# Patient Record
Sex: Male | Born: 1960 | Race: White | Hispanic: No | Marital: Single
Health system: Midwestern US, Academic
[De-identification: ages and names within clinical notes are randomized; demographics above are authoritative.]

---

## 2016-06-13 MED ORDER — LABETALOL 300 MG PO TAB
300 mg | ORAL_TABLET | Freq: Two times a day (BID) | ORAL | 1 refills | 60.00000 days | Status: DC
Start: 2016-06-13 — End: 2016-06-13

## 2016-06-13 MED ORDER — LISINOPRIL 20 MG PO TAB
20 mg | ORAL_TABLET | Freq: Every day | ORAL | 1 refills | Status: DC
Start: 2016-06-13 — End: 2016-08-15

## 2016-06-13 MED ORDER — PRAVASTATIN 40 MG PO TAB
40 mg | ORAL_TABLET | Freq: Every evening | ORAL | 1 refills | 90.00000 days | Status: DC
Start: 2016-06-13 — End: 2016-06-13

## 2016-06-13 MED ORDER — LISINOPRIL 20 MG PO TAB
20 mg | ORAL_TABLET | Freq: Every day | ORAL | 1 refills | Status: DC
Start: 2016-06-13 — End: 2016-06-13

## 2016-06-13 MED ORDER — LABETALOL 300 MG PO TAB
300 mg | ORAL_TABLET | Freq: Two times a day (BID) | ORAL | 1 refills | 60.00000 days | Status: DC
Start: 2016-06-13 — End: 2017-01-22

## 2016-06-13 MED ORDER — PRAVASTATIN 40 MG PO TAB
40 mg | ORAL_TABLET | Freq: Every evening | ORAL | 1 refills | 90.00000 days | Status: DC
Start: 2016-06-13 — End: 2016-08-15

## 2016-08-04 ENCOUNTER — Encounter: Admit: 2016-08-04 | Discharge: 2016-08-04 | Payer: BC Managed Care – PPO

## 2016-08-04 NOTE — Telephone Encounter
-----   Message from Jorje GuildBrian C Weiford, MD sent at 08/04/2016 11:12 AM CDT -----  Adam Russo's triglycerides remain a little high, but are better than they have been in the past.  As well, his LDL cholesterol is much better than last year.  He used to take fish oil, but it looks like he is off it now.  Could you find out if he is or isn't taking any fish oil?  If not, I'd recommend he get back on at least 3 OTC fish oil caps/day.  Otherwise, TLCs (therapeutic lifestyle changes with diet, exercise, and weight loss) would be the most appropriate course at this time.  thanks

## 2016-08-04 NOTE — Telephone Encounter
LVM on phone: [phone] explaining Dr. Shon MilletWeiford's recommendations.  Also released lab results and recommendations to MyChart.

## 2016-08-04 NOTE — Telephone Encounter
Pt called back. We reviewed lab results and Dr. Shon MilletWeiford's recommendations. Pt stated that stopping taking fish oil about 2 weeks ago, but was take 4 fish oil caps/day prior to that. He stated that he would restart.

## 2016-08-15 ENCOUNTER — Ambulatory Visit: Admit: 2016-08-15 | Discharge: 2016-08-16 | Payer: BC Managed Care – PPO

## 2016-08-15 ENCOUNTER — Encounter: Admit: 2016-08-15 | Discharge: 2016-08-15 | Payer: BC Managed Care – PPO

## 2016-08-15 DIAGNOSIS — R931 Abnormal findings on diagnostic imaging of heart and coronary circulation: Secondary | ICD-10-CM

## 2016-08-15 DIAGNOSIS — M1990 Unspecified osteoarthritis, unspecified site: ICD-10-CM

## 2016-08-15 DIAGNOSIS — I10 Essential (primary) hypertension: Principal | ICD-10-CM

## 2016-08-15 NOTE — Progress Notes
Date of Service: 08/15/2016    Adam Russo is a 56 y.o. male.       HPI     Adam Russo presents for evaluation and management of atherosclerotic risk factors.  I last saw him a little over 1 year ago, and he has recently been evaluated in a wellness clinic and wanted to review his test results and get recommendations about ongoing management, including my opinion about changes in his medical regimen that were made in the wellness clinic.  His risks factors for atherosclerosis include hypertension and hypercholesterolemia.  He has no current angina, dyspnea, or palpitation symptoms.  His most recent lipid panel included:  Lab Results   Component Value Date    LDL 89 08/02/2016    HDL 39 (L) 08/02/2016    CHOL 165 08/02/2016    TRIG 183 (H) 08/02/2016    AST 23 08/02/2016    ALT 43 08/02/2016   He reports that he wasn't taking his fish oil as religiously as he should at the time of the above fasting lipid panel.  Since early May of this year, Adam Russo has been taking atorvastatin instead of simva at the recommendation of Dr. Christian Mate, whom Adam Russo saw in April after a [facility] clinic visit.  He also had a coronary artery calcium score which was 43, with calcification noted in the LAD and RCA vessels.     Adam Russo's blood pressure today is 140/100 mm Hg, and he reports that at home, it tends to run in the 120-140/85-105 mm Hg range.  Dr. Rito Ehrlich also started him on Lotrel 5/20 mg daily, which Adam Russo has been on for ~1 month now.    He denies bleeding problems with aspirin or myalgias with statin therapy.  He exercises occasionally--Adam Russo reports staying active but that he does not exercise as regularly as he should.  He is generally trying to follow a heart-healthy diet.       He isn't diabetic, doesn't smoke, and denies secondhand smoke exposure.  Since our last visit, he has lost 13 pounds which he attributes to eating in a healthier fashion and cutting out simple carbohydrates and sugars.       Vitals: 08/15/16 0806   BP: (!) 140/110   Pulse: 72   Weight: 84.8 kg (187 lb)   Height: 1.753 m (5' 9)     Body mass index is 27.62 kg/m???.     Past Medical History  Patient Active Problem List    Diagnosis Date Noted   ??? Agatston CAC score, <100 08/15/2016   ??? Fatty liver: noted incidentally on recent CAC score CT 08/15/2016   ??? Primary osteoarthritis of right knee 10/23/2015   ??? Family history of abdominal aortic aneurysm 05/26/2011   ??? History of Myalgia 02/09/2010   ??? History of Elevated CPK 02/09/2010   ??? Essential hypertension 02/02/2010   ??? Mild concentric left ventricular hypertrophy (LVH) 02/02/2010     ??? 2011 ECG demonstrating voltage criteria for LVH, but has had echo showing LV thickness at the upper limits of normal.  ??? 2013 echocardiography showing mild left ventricular hypertrophy      ??? Mixed hyperlipidemia 02/02/2010         Review of Systems   Constitution: Negative.   HENT: Negative.    Eyes: Negative.    Cardiovascular: Negative.    Respiratory: Negative.    Endocrine: Negative.    Hematologic/Lymphatic: Negative.    Skin: Negative.    Musculoskeletal: Negative.  Gastrointestinal: Negative.    Genitourinary: Negative.    Neurological: Negative.    Psychiatric/Behavioral: Negative.    Allergic/Immunologic: Negative.        Physical Exam  General Appearance: Adam Russo is a gentleman with overweight body habitus who looks his stated age, and appears comfortable in no distress   Skin: warm, no ulcers or xanthomas   Digits and Nails: no cyanosis or clubbing   Eyes: conjunctivae and lids normal, pupils are equal and round   Teeth/Gums/Palate: dentition unremarkable   Lips & Oral Mucosa: no pallor or cyanosis   Neck Veins: neck veins are not distended; normal JVP (< or = 7cm) at 20-30 degrees incline   Chest Inspection: chest is normal in appearance   Respiratory Effort: breathing comfortably, no respiratory distress   Auscultation/Percussion: lungs clear to auscultation, no rales or rhonchi, no wheezing Cardiac Rhythm: regular rhythm and normal rate   Cardiac Auscultation: S1, S2 normal but clear splitting not heard, no rub, no clear S3 or S4 gallops heard  Murmurs: soft systolic murmur at the base without radiation heard on exam today   Peripheral Circulation: normal peripheral circulation   Carotid Arteries: no bruits   Radial Arteries: normal symmetric radial pulses   Abdominal Aorta: no abdominal aortic bruit   Pedal Pulses: normal symmetric pedal pulses   Lower Extremity Edema: no lower extremity edema   Abdominal Exam: soft, non-tender, non-distended, normal bowel sounds; no hepatosplenomegaly or masses   Gait & Station: walks without assistance   Muscle Strength: normal muscle tone  Orientation: oriented to time, place and person   Affect & Mood: appropriate and sustained affect   Language and Memory: patient responsive and seems to comprehend information   Neurologic Exam: neurologically nonfocal, no obvious CN deficits, and moves all extremities     Cardiovascular Studies  No ECG done today--he reports having a normal ECG at [facility] recently (although we haven't been able to obtain the tracing)    Problems Addressed Today  Encounter Diagnoses   Name Primary?   ??? Essential hypertension: moderately elevated BP today, with variable readings at home Yes   ??? Agatston CAC score, <100    ??? Mixed hyperlipidemia: persistently mildly elevated TGs    ??? Family history of abdominal aortic aneurysm    ??? Fatty liver: noted incidentally on recent CAC score CT        Assessment and Plan     1. Essential hypertension: moderately elevated BP today, with variable readings at home    2. Agatston CAC score, <100    3. Mixed hyperlipidemia: persistently mildly elevated TGs    4. Family history of abdominal aortic aneurysm    5. Fatty liver: noted incidentally on recent CAC score CT       I had a long discussion with Adam Russo today about his complex issues and ongoing diagnostic and treatment options.  I asked him to do the following: ??? Get a home automatic blood pressure monitor and start checking blood pressure on a regular basis (at least 2-3 times a week, but ideally every day) first thing in the AM.    ??? Give [place] a status report on the blood pressure readings within 4 weeks.    ??? We discussed that ideally, the goal BP should be < or = 130/80 mm Hg, and if the blood pressure remains higher than goal, we should increase Lotrel to 10/40 mg daily.  ??? Try to keep on a salt restricted diet (2000 milligrams or  less of sodium daily) for stable blood pressure control and prevention of stroke, heart failure and kidney failure.    ??? Keep getting regular (ideally, every day) exercise with a combination of aerobic, resistance, and flexibility exercise.  ??? We discussed that about 150 minutes of aerobic exercise per week is a good goal to aim for at a minimum.    ??? Send me a copy of the planned blood work from [facility] in August.    ??? We discussed that if the triglycerides remain elevated despite TLCs (therapeutic lifestyle changes with diet, exercise, and weight loss), atorvastatin, and religious use of fish oil, we will consider addition of a fibrate agent like fenofibrate.  ??? Return for a follow up clinic visit in 1 year, or sooner for new questions or problems.     Thanks Dr. Newt Lukes for allowing me to see this nice patient.  If I can elaborate any further or be of additional assistance, please don't hesitate to contact me.             Current Medications (including today's revisions)  ??? ALPRAZolam (XANAX) 1 mg tablet Take 1 tablet by mouth at bedtime as needed for Anxiety.   ??? amlodipine-benazepril (LOTREL) 5-20 mg capsule Take 1 capsule by mouth daily.   ??? atorvastatin (LIPITOR) 20 mg tablet Take 20 mg by mouth daily.   ??? azithromycin (ZITHROMAX) 250 mg tablet Take 2 tabs by mouth on day 1, followed by 1 tab by mouth daily on days 2 - 5. (Patient taking differently: as Needed. Take 2 tabs by mouth on day 1, followed by 1 tab by mouth daily on days 2 - 5.)   ??? dextroamphetamine-amphetamine (ADDERALL) 20 mg tablet Take 1 tablet by mouth twice daily Earliest Fill Date: 05/22/16   ??? docosahexanoic acid/epa (FISH OIL PO) Take 4 capsules by mouth daily.   ??? escitalopram oxalate (LEXAPRO) 10 mg tablet Take 1 tablet by mouth daily.   ??? labetalol (NORMODYNE) 300 mg tablet Take 1 tablet by mouth twice daily.           ~40 minutes spent (822 - 859) in face to face conference in the office today, with more than 20 minutes of the visit spent in counseling and coordination of care--BCW.

## 2016-08-15 NOTE — Progress Notes
I called the patient to follow up on the MyChart message he sent. The pt was seen in clinic today and his BP was 140/110. He states [facility] his BP was 172/108 and he was started on Lotrel 5/20 mg daily. He checked his BP several times this afternoon and the readings were 160/96 and 177/104. The pt is going out of the country in 4 weeks and wants to make sure his BP is under better control prior to traveling. I instructed the patient to monitor his BP daily and to call me if the BP remains higher than goal (<130/80). I reviewed Dr. Lollie MarrowWeifords recommendations from the appointment today (if BP is higher than goal  we should increase Lotrel to 10/40 mg daily). The pt will send in BP readings thru Mychart or call my direct number. The pt has been taking the Lotrel in the evening and is asking if he should take in the morning. I reviewed that taking the Lotrel in the morning with his other medications is okay.

## 2016-08-16 DIAGNOSIS — K760 Fatty (change of) liver, not elsewhere classified: ICD-10-CM

## 2016-08-16 DIAGNOSIS — Z8249 Family history of ischemic heart disease and other diseases of the circulatory system: ICD-10-CM

## 2016-08-16 DIAGNOSIS — E782 Mixed hyperlipidemia: ICD-10-CM

## 2016-08-16 DIAGNOSIS — I10 Essential (primary) hypertension: Principal | ICD-10-CM

## 2016-08-18 ENCOUNTER — Encounter: Admit: 2016-08-18 | Discharge: 2016-08-18 | Payer: BC Managed Care – PPO

## 2016-08-28 ENCOUNTER — Encounter: Admit: 2016-08-28 | Discharge: 2016-08-28 | Payer: BC Managed Care – PPO

## 2016-08-28 MED ORDER — DEXTROAMPHETAMINE-AMPHETAMINE 20 MG PO TAB
20 mg | ORAL_TABLET | Freq: Two times a day (BID) | ORAL | 0 refills | Status: AC
Start: 2016-08-28 — End: 2016-08-28

## 2016-08-28 MED ORDER — DEXTROAMPHETAMINE-AMPHETAMINE 20 MG PO TAB
20 mg | ORAL_TABLET | Freq: Two times a day (BID) | ORAL | 0 refills | Status: AC
Start: 2016-08-28 — End: 2017-08-10

## 2017-01-22 ENCOUNTER — Encounter: Admit: 2017-01-22 | Discharge: 2017-01-22 | Payer: BC Managed Care – PPO

## 2017-01-22 MED ORDER — LABETALOL 300 MG PO TAB
300 mg | ORAL_TABLET | Freq: Two times a day (BID) | ORAL | 2 refills | 60.00000 days | Status: AC
Start: 2017-01-22 — End: 2017-06-20

## 2017-01-22 NOTE — Telephone Encounter
Routing to Dr. Greiner for approval.

## 2017-01-23 ENCOUNTER — Encounter: Admit: 2017-01-23 | Discharge: 2017-01-23 | Payer: BC Managed Care – PPO

## 2017-01-23 MED ORDER — ALPRAZOLAM 1 MG PO TAB
1 mg | ORAL_TABLET | Freq: Every evening | ORAL | 5 refills | Status: AC | PRN
Start: 2017-01-23 — End: 2017-08-10

## 2017-01-23 NOTE — Telephone Encounter
Rx called into [facility] pharmacy.

## 2017-01-26 ENCOUNTER — Encounter: Admit: 2017-01-26 | Discharge: 2017-01-26 | Payer: BC Managed Care – PPO

## 2017-01-30 NOTE — Progress Notes
Spoke with patient, he has been scheduled for OV with Dr. Royann ShiversSchroeppel on 02/21/17 to further discuss his options for knee pain/considering injection prior to planned travel in January.

## 2017-02-09 ENCOUNTER — Encounter: Admit: 2017-02-09 | Discharge: 2017-02-09

## 2017-02-09 ENCOUNTER — Encounter: Admit: 2017-02-09 | Discharge: 2017-02-10

## 2017-02-09 DIAGNOSIS — R69 Illness, unspecified: Principal | ICD-10-CM

## 2017-02-14 ENCOUNTER — Encounter: Admit: 2017-02-14 | Discharge: 2017-02-14 | Payer: BC Managed Care – PPO

## 2017-02-19 ENCOUNTER — Encounter: Admit: 2017-02-19 | Discharge: 2017-02-19 | Payer: BC Managed Care – PPO

## 2017-02-19 DIAGNOSIS — M25561 Pain in right knee: Principal | ICD-10-CM

## 2017-02-21 ENCOUNTER — Encounter: Admit: 2017-02-21 | Discharge: 2017-02-21 | Payer: BC Managed Care – PPO

## 2017-02-21 ENCOUNTER — Ambulatory Visit: Admit: 2017-02-21 | Discharge: 2017-02-21 | Payer: BC Managed Care – PPO

## 2017-02-21 ENCOUNTER — Ambulatory Visit: Admit: 2017-02-21 | Discharge: 2017-02-21

## 2017-02-21 DIAGNOSIS — M1712 Unilateral primary osteoarthritis, left knee: ICD-10-CM

## 2017-02-21 DIAGNOSIS — M25561 Pain in right knee: Principal | ICD-10-CM

## 2017-02-21 DIAGNOSIS — M25562 Pain in left knee: ICD-10-CM

## 2017-02-21 DIAGNOSIS — G8929 Other chronic pain: ICD-10-CM

## 2017-02-21 DIAGNOSIS — M1990 Unspecified osteoarthritis, unspecified site: ICD-10-CM

## 2017-02-21 DIAGNOSIS — M1711 Unilateral primary osteoarthritis, right knee: ICD-10-CM

## 2017-02-21 DIAGNOSIS — I10 Essential (primary) hypertension: Principal | ICD-10-CM

## 2017-02-21 NOTE — Procedures
Large Joint Drain/Inject  Location: knee L knee    Consent:   Consent obtained: verbal  Consent given by: patient  Risks discussed: infection, nerve damage, pain, skin discoloration and soft tissue reaction  Alternatives discussed: alternative treatment  Discussed with patient the purpose of the treatment/procedure, other ways of treating my condition, including no treatment/ procedure and the risks and benefits of the alternatives. Patient has decided to proceed with treatment/procedure.        Universal Protocol:  Relevant documents: relevant documents present and verified  Test results: test results available and properly labeled  Imaging studies: imaging studies available  Required items: required blood products, implants, devices, and special equipment available  Site marked: the operative site was marked  Patient identity confirmed: Patient identify confirmed verbally with patient.      Time out: Immediately prior to procedure a "time out" was called to verify the correct patient, procedure, equipment, support staff and site/side marked as required      Procedures Details:   Indications: pain   Prep: povidone-iodine  Local anesthetic: ethyl chloride spray  Needle size: 22 G  Approach: lateral and superior  Medications administered: 4 mL ropivacaine (PF) 0.5% (5 mg/mL); 40 mg triamcinolone acetonide 40 mg/mL  Aspirate amount: 25 mL  Aspirate: serous  Patient tolerance: Patient tolerated the procedure well with no immediate complications. Pressure was applied, and hemostasis was accomplished.

## 2017-02-21 NOTE — Procedures
Large Joint Drain/Inject  Location: knee R knee    Consent:   Consent obtained: verbal  Consent given by: patient  Risks discussed: infection, nerve damage, pain, skin discoloration and soft tissue reaction  Alternatives discussed: alternative treatment  Discussed with patient the purpose of the treatment/procedure, other ways of treating my condition, including no treatment/ procedure and the risks and benefits of the alternatives. Patient has decided to proceed with treatment/procedure.        Universal Protocol:  Relevant documents: relevant documents present and verified  Test results: test results available and properly labeled  Imaging studies: imaging studies available  Required items: required blood products, implants, devices, and special equipment available  Site marked: the operative site was marked  Patient identity confirmed: Patient identify confirmed verbally with patient.      Time out: Immediately prior to procedure a "time out" was called to verify the correct patient, procedure, equipment, support staff and site/side marked as required      Procedures Details:   Indications: pain   Prep: povidone-iodine  Local anesthetic: ethyl chloride spray  Needle size: 22 G  Approach: lateral and superior  Medications administered: 4 mL ropivacaine (PF) 0.5% (5 mg/mL); 40 mg triamcinolone acetonide 40 mg/mL  Aspirate amount: 12 mL  Aspirate: serous  Patient tolerance: Patient tolerated the procedure well with no immediate complications. Pressure was applied, and hemostasis was accomplished.

## 2017-02-21 NOTE — Progress Notes
Date of Service: 02/21/2017    History of Present Illness  Adam Russo is a 56 y.o. male who presents in f/u of his bilateral knees.  He received a right knee aspiration and cortisone injection on December 01, 2015, which he feels provided improvement in his pain.  He continues to localize the majority of his pain along the medial aspect of both knees, as well as mild tenderness along the lateral aspect of his right knee today.  He also reports frequent give-way episodes of both knees and describes occasional mechanical symptoms, such as catching and locking. ???He denies any trauma to both knees or any systemic symptoms such as fever, chills, nausea, vomiting or any pain in any of his other joints.  He received a left knee cortisone injection, performed by an outside physician last month, which he feels provided little to no benefit.  He feels as though the brace he received at his previous evaluation aggravated his medial sided pain after a few days of wear, so he returned it.  Denies new injury.       Review of Systems   Musculoskeletal: Positive for arthralgias.         Objective:         ??? ALPRAZolam (XANAX) 1 mg tablet TAKE 1 TABLET BY MOUTH AT BEDTIME AS NEEDED FOR ANXIETY   ??? amlodipine-benazepril (LOTREL) 5-20 mg capsule Take 1 capsule by mouth daily.   ??? atorvastatin (LIPITOR) 20 mg tablet Take 20 mg by mouth daily.   ??? azithromycin (ZITHROMAX) 250 mg tablet Take 2 tabs by mouth on day 1, followed by 1 tab by mouth daily on days 2 - 5. (Patient taking differently: as Needed. Take 2 tabs by mouth on day 1, followed by 1 tab by mouth daily on days 2 - 5.)   ??? dextroamphetamine-amphetamine (ADDERALL) 20 mg tablet Take 1 tablet by mouth twice daily   ??? docosahexanoic acid/epa (FISH OIL PO) Take 4 capsules by mouth daily.   ??? escitalopram oxalate (LEXAPRO) 10 mg tablet Take 1 tablet by mouth daily.   ??? labetalol (NORMODYNE) 300 mg tablet Take one tablet by mouth twice daily.     Vitals: 02/21/17 1020   BP: (!) 152/97   Pulse: 74   Resp: 16   SpO2: 100%   Weight: 83.9 kg (185 lb)   Height: 175.3 cm (69)     Body mass index is 27.32 kg/m???.     Physical Exam  Ortho Exam  General Healthy appearing 56 y.o. male in no acute distress.  Alert and oriented x 3.  Mood and affect are appropriate today.  ???  Constitutional:  He is oriented to person, place, and time.  He appears well-developed and well-nourished.   HENT:   Head: Normocephalic and atraumatic.   Eyes: Conjunctivae and EOM are normal.   Cardiovascular: Normal rate and intact distal pulses.   Pulmonary/Chest: Effort normal. No respiratory distress.   Neurological:  He is alert and oriented to person, place, and time.   Skin: Skin is warm and dry.   Psychiatric:  He has a normal mood and affect.  His behavior is normal. Judgment and thought content normal.   Nursing note and vitals reviewed.  ???  Knee Exam:  Right???knee was evaluated. ???Skin intact. ???ROM 0-130???degrees. ???Mild to moderate effusion. ???Stable to varus/valgus stress at 0 and 30 degrees, with mild pseudo laxity to valgus stress at 30 degrees. ???Mild???lateral jt line tenderness.  Significant medial jt line  tenderness. ???Medial and lateral McMurray test is negative.  Negative???lachman. ???Negative???anterior drawer. ???Negative???posterior drawer. ???Negative???patellar apprehension test. ???Mild???retropatellar crepitus noted. ???Quad MS 5/5. ???Neurovascularly intact.  ??????  Comments:  Mild genu varum.  Negative pivot shift.  Normal patellar tracking.  Positive grind test.  Patellar and quadriceps tendons are intact and non-tender.  1-2???quadrants???of lateral patellar excursion, with a firm end point.    Knee Exam:  Left???knee was evaluated. ???Skin intact. ???ROM 0-130???degrees, with aggravation of posterior knee pain. ???Mild effusion. ???Stable to varus/valgus stress at 0 and 30 degrees. ???Negative???lateral jt line tenderness.  Positive???medial jt line tenderness. ???Positive medial McMurray test, negative lateral.  Negative???lachman. ???Negative???anterior drawer. ???Negative???posterior drawer. ???Negative???patellar apprehension test. ???Mild???retropatellar crepitus noted. ???Quad MS 5/5. ???Neurovascularly intact.  ??????  Comments:  Mild genu varum.  Negative pivot shift.  Normal patellar tracking.  Positive grind test.  Patellar and quadriceps tendons are intact and non-tender.  1-2???quadrants???of lateral patellar excursion, with a firm end point.    Imaging:  Orthogonal radiographs of both knees were obtained and reviewed.  Right knee:  1. ???Progression of now severe arthrosis involving the right knee medial compartment with flattening of the central medial femoral condyle and bone-on-bone articulation involving the medial compartment.  2. ???Slight lateral subluxation of the tibia with respect to the femur.  3. ???Large osteophyte arising from the superior right patella. Moderate to severe patellofemoral joint arthrosis with small joint effusion.  4. ???Osteochondral bodies are less likely arterial calcifications project posterior to the fibular neck. These may be intra-articular.  Left knee:  1. ???Mild to moderate left knee medial compartment arthrosis, unchanged since prior.  2.  Otherwise mild patellofemoral joint arthrosis with trace effusion. Small osteochondral body posterior to the knee could be intra-articular.    Imaging:  An MRI of his right knee was reviewed.  IMPRESSION:  1. ???High-grade near full-thickness or full-thickness tear of the medial   meniscus posterior root attachment. Nondisplaced horizontal tear component   extends to the junction of the body of the medial meniscus. Mildly   displaced flap component in the inferior medial gutter.  2. ???Tricompartmental arthrosis with high-grade chondrosis involving the   medial compartment and medial trochlear groove of the patellofemoral   compartment.  3. ???Small-to-moderate joint effusion with multiple intra-articular bodies. Approved by Andreas Ohm, M.D. on 10/20/2015 11:38 AM    An MRI of his left knee was reviewed, performed on February 09, 2017, which indicated a Baker's cyst posteriorly, and posterior horn medial meniscus tear noted.       Assessment and Plan:    56 y.o.male with  1. Bilateral knee DJD  2. Full-thickness posterior root medial meniscus tear, right knee  3. Left knee posterior horn medial meniscus tear  4. Baker's cyst, left knee  ???  I discussed the patients history, physical exam, and imaging findings today.  Given the fact he has an upcoming trip planned to [place], he would like to proceed with bilateral knee aspirations, given his mild to moderate effusions, and cortisone???injections today for therapeutic purposes, in conjunction with a thigh-high TED hose for swelling, particularly when sitting for long periods of time on his plane ride to [place].  I again advised him to refrain from repetitive impact activities and discussed the importance of maintaining a consistent low impact exercise program, such as an elliptical machine, stationary bicycle, and/or aquatic therapy. ???We also discussed the options and benefits of PRP injections and Viscosupplementation in the future.  With regard to his right knee, we  again briefly discussed the option of a posterior root repair, but given the degree of arthrosis in his medial compartment, he would be at high risk of persistent medial-sided pain post-operatively. ???He does not require a scheduled followup with [place], but certainly can return at any point in the future as his symptoms dictate. ???All of his questions were answered to his satisfaction. ???He voiced clear understanding of this treatment plan and agreed.    -Ice and NSAIDs.            ATTESTATION    I have taken down these notes in the presence of Dr. Samuel Germany Oriel Rumbold.    Staff name:  Vassie Loll Date:  02/21/2017

## 2017-02-22 ENCOUNTER — Encounter: Admit: 2017-02-22 | Discharge: 2017-02-22 | Payer: BC Managed Care – PPO

## 2017-02-22 DIAGNOSIS — R69 Illness, unspecified: Principal | ICD-10-CM

## 2017-02-23 MED ORDER — CIPROFLOXACIN HCL 500 MG PO TAB
500 mg | ORAL_TABLET | Freq: Two times a day (BID) | ORAL | 0 refills | 10.00000 days | Status: AC
Start: 2017-02-23 — End: 2017-08-10

## 2017-02-23 MED ORDER — DOXYCYCLINE HYCLATE 100 MG PO CAP
ORAL_CAPSULE | ORAL | 0 refills | 8.00000 days | Status: AC
Start: 2017-02-23 — End: 2017-08-10

## 2017-02-23 MED ORDER — DIAZEPAM 10 MG PO TAB
10 mg | ORAL_TABLET | ORAL | 2 refills | 7.00000 days | Status: AC | PRN
Start: 2017-02-23 — End: 2017-08-10

## 2017-02-23 MED ORDER — AZITHROMYCIN 250 MG PO TAB
ORAL_TABLET | Freq: Every day | 0 refills | Status: AC
Start: 2017-02-23 — End: 2017-08-10

## 2017-02-25 MED ORDER — ROPIVACAINE (PF) 5 MG/ML (0.5 %) IJ SOLN
4 mL | Freq: Once | INTRAMUSCULAR | 0 refills | Status: CP | PRN
Start: 2017-02-25 — End: ?
  Administered 2017-02-21: 17:00:00 4 mL via INTRAMUSCULAR

## 2017-02-25 MED ORDER — TRIAMCINOLONE ACETONIDE 40 MG/ML IJ SUSP
40 mg | Freq: Once | INTRAMUSCULAR | 0 refills | Status: CP | PRN
Start: 2017-02-25 — End: ?
  Administered 2017-02-21: 17:00:00 40 mg via INTRAMUSCULAR

## 2017-02-26 NOTE — Progress Notes
Called Diazepam into the pharmacy

## 2017-05-27 ENCOUNTER — Encounter: Admit: 2017-05-27 | Discharge: 2017-05-27 | Payer: BC Managed Care – PPO

## 2017-06-14 ENCOUNTER — Encounter: Admit: 2017-06-14 | Discharge: 2017-06-14 | Payer: BC Managed Care – PPO

## 2017-06-15 ENCOUNTER — Encounter: Admit: 2017-06-15 | Discharge: 2017-06-15 | Payer: BC Managed Care – PPO

## 2017-06-20 ENCOUNTER — Encounter: Admit: 2017-06-20 | Discharge: 2017-06-20 | Payer: BC Managed Care – PPO

## 2017-06-20 DIAGNOSIS — M170 Bilateral primary osteoarthritis of knee: Principal | ICD-10-CM

## 2017-06-20 MED ORDER — ATORVASTATIN 20 MG PO TAB
20 mg | ORAL_TABLET | Freq: Every day | ORAL | 3 refills | Status: AC
Start: 2017-06-20 — End: 2017-12-11

## 2017-06-20 MED ORDER — LABETALOL 300 MG PO TAB
300 mg | ORAL_TABLET | Freq: Two times a day (BID) | ORAL | 3 refills | 60.00000 days | Status: AC
Start: 2017-06-20 — End: 2017-12-11

## 2017-06-20 MED ORDER — AMLODIPINE-BENAZEPRIL 5-20 MG PO CAP
1 | ORAL_CAPSULE | Freq: Every day | ORAL | 3 refills | Status: AC
Start: 2017-06-20 — End: 2017-12-11

## 2017-06-25 ENCOUNTER — Encounter: Admit: 2017-06-25 | Discharge: 2017-06-25 | Payer: BC Managed Care – PPO

## 2017-06-25 ENCOUNTER — Ambulatory Visit: Admit: 2017-06-25 | Discharge: 2017-06-26 | Payer: Private Health Insurance - Indemnity

## 2017-06-25 DIAGNOSIS — M1990 Unspecified osteoarthritis, unspecified site: ICD-10-CM

## 2017-06-25 DIAGNOSIS — I10 Essential (primary) hypertension: Principal | ICD-10-CM

## 2017-06-25 MED ORDER — ROPIVACAINE (PF) 5 MG/ML (0.5 %) IJ SOLN
4 mL | Freq: Once | INTRAMUSCULAR | 0 refills | Status: CP | PRN
Start: 2017-06-25 — End: ?
  Administered 2017-06-25: 15:00:00 4 mL via INTRAMUSCULAR

## 2017-06-25 MED ORDER — TRIAMCINOLONE ACETONIDE 40 MG/ML IJ SUSP
40 mg | Freq: Once | INTRAMUSCULAR | 0 refills | Status: CP | PRN
Start: 2017-06-25 — End: ?
  Administered 2017-06-25: 15:00:00 40 mg via INTRAMUSCULAR

## 2017-06-26 DIAGNOSIS — M170 Bilateral primary osteoarthritis of knee: Principal | ICD-10-CM

## 2017-07-31 ENCOUNTER — Encounter: Admit: 2017-07-31 | Discharge: 2017-07-31 | Payer: BC Managed Care – PPO

## 2017-08-01 ENCOUNTER — Encounter: Admit: 2017-08-01 | Discharge: 2017-08-01 | Payer: BC Managed Care – PPO

## 2017-08-06 ENCOUNTER — Encounter: Admit: 2017-08-06 | Discharge: 2017-08-06 | Payer: BC Managed Care – PPO

## 2017-08-07 ENCOUNTER — Encounter: Admit: 2017-08-07 | Discharge: 2017-08-07 | Payer: BC Managed Care – PPO

## 2017-08-10 ENCOUNTER — Encounter: Admit: 2017-08-10 | Discharge: 2017-08-10 | Payer: BC Managed Care – PPO

## 2017-08-10 ENCOUNTER — Ambulatory Visit: Admit: 2017-08-10 | Discharge: 2017-08-11 | Payer: Private Health Insurance - Indemnity

## 2017-08-10 DIAGNOSIS — M1990 Unspecified osteoarthritis, unspecified site: ICD-10-CM

## 2017-08-10 DIAGNOSIS — I10 Essential (primary) hypertension: Principal | ICD-10-CM

## 2017-08-10 MED ORDER — EUFLEXXA 10 MG/ML(MW 2.4 -3.6 MILLION) IX SYRG
20 mg | Freq: Once | INTRA_ARTICULAR | 0 refills | Status: CP | PRN
Start: 2017-08-10 — End: ?
  Administered 2017-08-10: 18:00:00 20 mg via INTRA_ARTICULAR

## 2017-08-11 DIAGNOSIS — M170 Bilateral primary osteoarthritis of knee: Principal | ICD-10-CM

## 2017-08-17 ENCOUNTER — Ambulatory Visit: Admit: 2017-08-17 | Discharge: 2017-08-18 | Payer: Private Health Insurance - Indemnity

## 2017-08-17 ENCOUNTER — Encounter: Admit: 2017-08-17 | Discharge: 2017-08-17 | Payer: BC Managed Care – PPO

## 2017-08-17 DIAGNOSIS — M1990 Unspecified osteoarthritis, unspecified site: ICD-10-CM

## 2017-08-17 DIAGNOSIS — I10 Essential (primary) hypertension: Principal | ICD-10-CM

## 2017-08-18 DIAGNOSIS — M170 Bilateral primary osteoarthritis of knee: Principal | ICD-10-CM

## 2017-08-23 MED ORDER — EUFLEXXA 10 MG/ML(MW 2.4 -3.6 MILLION) IX SYRG
20 mg | Freq: Once | INTRA_ARTICULAR | 0 refills | Status: CP | PRN
Start: 2017-08-23 — End: ?
  Administered 2017-08-17: 15:00:00 20 mg via INTRA_ARTICULAR

## 2017-08-27 ENCOUNTER — Encounter: Admit: 2017-08-27 | Discharge: 2017-08-27 | Payer: BC Managed Care – PPO

## 2017-08-27 ENCOUNTER — Ambulatory Visit: Admit: 2017-08-27 | Discharge: 2017-08-28 | Payer: Private Health Insurance - Indemnity

## 2017-08-27 DIAGNOSIS — I10 Essential (primary) hypertension: Principal | ICD-10-CM

## 2017-08-27 DIAGNOSIS — M1990 Unspecified osteoarthritis, unspecified site: ICD-10-CM

## 2017-08-27 MED ORDER — EUFLEXXA 10 MG/ML(MW 2.4 -3.6 MILLION) IX SYRG
20 mg | Freq: Once | INTRA_ARTICULAR | 0 refills | Status: CP | PRN
Start: 2017-08-27 — End: ?

## 2017-08-27 MED ORDER — EUFLEXXA 10 MG/ML(MW 2.4 -3.6 MILLION) IX SYRG
20 mg | Freq: Once | INTRA_ARTICULAR | 0 refills | Status: CP | PRN
Start: 2017-08-27 — End: ?
  Administered 2017-08-27: 19:00:00 20 mg via INTRA_ARTICULAR

## 2017-08-28 DIAGNOSIS — M170 Bilateral primary osteoarthritis of knee: Principal | ICD-10-CM

## 2017-08-30 ENCOUNTER — Ambulatory Visit: Admit: 2017-08-30 | Discharge: 2017-08-31 | Payer: Private Health Insurance - Indemnity

## 2017-08-31 DIAGNOSIS — R2689 Other abnormalities of gait and mobility: ICD-10-CM

## 2017-08-31 DIAGNOSIS — M170 Bilateral primary osteoarthritis of knee: Principal | ICD-10-CM

## 2017-09-12 ENCOUNTER — Encounter: Admit: 2017-09-12 | Discharge: 2017-09-12 | Payer: BC Managed Care – PPO

## 2017-09-17 ENCOUNTER — Ambulatory Visit: Admit: 2017-09-17 | Discharge: 2017-09-18 | Payer: BC Managed Care – PPO

## 2017-09-18 DIAGNOSIS — R2689 Other abnormalities of gait and mobility: ICD-10-CM

## 2017-09-18 DIAGNOSIS — M170 Bilateral primary osteoarthritis of knee: Principal | ICD-10-CM

## 2017-10-03 ENCOUNTER — Encounter: Admit: 2017-10-03 | Discharge: 2017-10-03 | Payer: BC Managed Care – PPO

## 2017-10-03 DIAGNOSIS — M1710 Unilateral primary osteoarthritis, unspecified knee: Principal | ICD-10-CM

## 2017-10-11 ENCOUNTER — Encounter: Admit: 2017-10-11 | Discharge: 2017-10-11 | Payer: BC Managed Care – PPO

## 2017-10-11 MED ORDER — CELECOXIB 200 MG PO CAP
200 mg | ORAL_CAPSULE | Freq: Every day | ORAL | 1 refills | Status: AC
Start: 2017-10-11 — End: 2018-02-14

## 2017-12-11 ENCOUNTER — Encounter: Admit: 2017-12-11 | Discharge: 2017-12-11 | Payer: BC Managed Care – PPO

## 2017-12-11 MED ORDER — LABETALOL 300 MG PO TAB
300 mg | ORAL_TABLET | Freq: Two times a day (BID) | ORAL | 0 refills | 60.00000 days | Status: AC
Start: 2017-12-11 — End: 2018-02-19

## 2017-12-11 MED ORDER — ATORVASTATIN 20 MG PO TAB
20 mg | ORAL_TABLET | Freq: Every day | ORAL | 0 refills | Status: AC
Start: 2017-12-11 — End: 2018-05-28

## 2017-12-11 MED ORDER — AMLODIPINE-BENAZEPRIL 5-20 MG PO CAP
1 | ORAL_CAPSULE | Freq: Every day | ORAL | 0 refills | Status: AC
Start: 2017-12-11 — End: 2018-02-19

## 2018-01-04 ENCOUNTER — Encounter: Admit: 2018-01-04 | Discharge: 2018-01-04 | Payer: BC Managed Care – PPO

## 2018-01-04 ENCOUNTER — Ambulatory Visit: Admit: 2018-01-04 | Discharge: 2018-01-05 | Payer: BC Managed Care – PPO

## 2018-01-04 DIAGNOSIS — I10 Essential (primary) hypertension: Principal | ICD-10-CM

## 2018-01-04 DIAGNOSIS — M1990 Unspecified osteoarthritis, unspecified site: ICD-10-CM

## 2018-01-04 MED ORDER — NAPROXEN 500 MG PO TAB
500 mg | ORAL_TABLET | Freq: Two times a day (BID) | ORAL | 1 refills | Status: AC
Start: 2018-01-04 — End: 2018-02-14

## 2018-01-04 MED ORDER — ROPIVACAINE (PF) 5 MG/ML (0.5 %) IJ SOLN
4 mL | Freq: Once | INTRAMUSCULAR | 0 refills | Status: CP | PRN
Start: 2018-01-04 — End: ?
  Administered 2018-01-04: 15:00:00 4 mL via INTRAMUSCULAR

## 2018-01-04 MED ORDER — TRIAMCINOLONE ACETONIDE 40 MG/ML IJ SUSP
40 mg | Freq: Once | INTRAMUSCULAR | 0 refills | Status: CP | PRN
Start: 2018-01-04 — End: ?
  Administered 2018-01-04: 15:00:00 40 mg via INTRAMUSCULAR

## 2018-01-04 MED ORDER — NAPROXEN 500 MG PO TAB
500 mg | ORAL_TABLET | Freq: Two times a day (BID) | ORAL | 1 refills | Status: AC
Start: 2018-01-04 — End: 2018-01-04

## 2018-01-05 DIAGNOSIS — M170 Bilateral primary osteoarthritis of knee: Principal | ICD-10-CM

## 2018-02-09 ENCOUNTER — Encounter: Admit: 2018-02-09 | Discharge: 2018-02-09 | Payer: BC Managed Care – PPO

## 2018-02-09 ENCOUNTER — Encounter: Admit: 2018-02-09 | Discharge: 2018-02-10 | Payer: BC Managed Care – PPO

## 2018-02-10 ENCOUNTER — Encounter: Admit: 2018-02-10 | Discharge: 2018-02-11 | Payer: BC Managed Care – PPO

## 2018-02-10 ENCOUNTER — Encounter: Admit: 2018-02-10 | Discharge: 2018-02-10 | Payer: BC Managed Care – PPO

## 2018-02-12 ENCOUNTER — Encounter: Admit: 2018-02-12 | Discharge: 2018-02-13 | Payer: BC Managed Care – PPO

## 2018-02-13 ENCOUNTER — Encounter: Admit: 2018-02-13 | Discharge: 2018-02-13 | Payer: BC Managed Care – PPO

## 2018-02-13 DIAGNOSIS — M1990 Unspecified osteoarthritis, unspecified site: ICD-10-CM

## 2018-02-13 DIAGNOSIS — I10 Essential (primary) hypertension: Principal | ICD-10-CM

## 2018-02-14 ENCOUNTER — Ambulatory Visit: Admit: 2018-02-14 | Discharge: 2018-02-15 | Payer: BC Managed Care – PPO

## 2018-02-14 ENCOUNTER — Encounter: Admit: 2018-02-14 | Discharge: 2018-02-14 | Payer: BC Managed Care – PPO

## 2018-02-14 DIAGNOSIS — G44319 Acute post-traumatic headache, not intractable: ICD-10-CM

## 2018-02-14 MED ORDER — AMOXICILLIN-POT CLAVULANATE 875-125 MG PO TAB
1 | ORAL_TABLET | Freq: Two times a day (BID) | ORAL | 3 refills | 7.00000 days | Status: AC
Start: 2018-02-14 — End: 2018-03-07

## 2018-02-14 MED ORDER — NEOMYCIN-POLYMYXIN-HC 3.5-10,000-1 MG/ML-UNIT/ML-% OT SOLN
3 [drp] | Freq: Four times a day (QID) | OTIC | 3 refills | 10.00000 days | Status: AC
Start: 2018-02-14 — End: 2018-03-07

## 2018-02-14 MED ORDER — ACETAMINOPHEN 325 MG PO TAB
325-625 mg | ORAL | 0 refills | Status: AC | PRN
Start: 2018-02-14 — End: 2018-03-07

## 2018-02-14 MED ORDER — MELATONIN 5 MG PO TAB
5 mg | Freq: Every evening | ORAL | 0 refills | 28.00000 days | Status: AC
Start: 2018-02-14 — End: 2019-05-07

## 2018-02-15 ENCOUNTER — Encounter: Admit: 2018-02-15 | Discharge: 2018-02-15 | Payer: BC Managed Care – PPO

## 2018-02-15 DIAGNOSIS — S069X9D Unspecified intracranial injury with loss of consciousness of unspecified duration, subsequent encounter: Principal | ICD-10-CM

## 2018-02-15 DIAGNOSIS — I10 Essential (primary) hypertension: Principal | ICD-10-CM

## 2018-02-15 DIAGNOSIS — M1990 Unspecified osteoarthritis, unspecified site: ICD-10-CM

## 2018-02-15 DIAGNOSIS — R2689 Other abnormalities of gait and mobility: Secondary | ICD-10-CM

## 2018-02-18 ENCOUNTER — Encounter: Admit: 2018-02-18 | Discharge: 2018-02-18 | Payer: BC Managed Care – PPO

## 2018-02-18 ENCOUNTER — Ambulatory Visit: Admit: 2018-02-18 | Discharge: 2018-02-18 | Payer: BC Managed Care – PPO

## 2018-02-18 DIAGNOSIS — I10 Essential (primary) hypertension: Principal | ICD-10-CM

## 2018-02-18 DIAGNOSIS — M1990 Unspecified osteoarthritis, unspecified site: ICD-10-CM

## 2018-02-18 DIAGNOSIS — S02109A Fracture of base of skull, unspecified side, initial encounter for closed fracture: Principal | ICD-10-CM

## 2018-02-18 DIAGNOSIS — S0219XD Other fracture of base of skull, subsequent encounter for fracture with routine healing: ICD-10-CM

## 2018-02-18 DIAGNOSIS — H90A31 Mixed conductive and sensorineural hearing loss, unilateral, right ear with restricted hearing on the contralateral side: Principal | ICD-10-CM

## 2018-02-18 DIAGNOSIS — H9221 Otorrhagia, right ear: ICD-10-CM

## 2018-02-18 DIAGNOSIS — H9011 Conductive hearing loss, unilateral, right ear, with unrestricted hearing on the contralateral side: Principal | ICD-10-CM

## 2018-02-19 ENCOUNTER — Encounter: Admit: 2018-02-19 | Discharge: 2018-02-19 | Payer: BC Managed Care – PPO

## 2018-02-19 MED ORDER — AMLODIPINE 10 MG PO TAB
10 mg | ORAL_TABLET | Freq: Every day | ORAL | 3 refills | Status: AC
Start: 2018-02-19 — End: 2018-05-28

## 2018-02-19 MED ORDER — FAMOTIDINE 20 MG PO TAB
20 mg | ORAL_TABLET | Freq: Two times a day (BID) | ORAL | 3 refills | 90.00000 days | Status: AC
Start: 2018-02-19 — End: 2018-05-28

## 2018-02-19 MED ORDER — LISINOPRIL-HYDROCHLOROTHIAZIDE 20-25 MG PO TAB
1 | ORAL_TABLET | Freq: Every morning | ORAL | 3 refills | 30.00000 days | Status: AC
Start: 2018-02-19 — End: 2018-02-19

## 2018-02-19 MED ORDER — LISINOPRIL-HYDROCHLOROTHIAZIDE 20-25 MG PO TAB
1 | ORAL_TABLET | Freq: Every morning | ORAL | 3 refills | 30.00000 days | Status: AC
Start: 2018-02-19 — End: 2018-05-28

## 2018-02-19 MED ORDER — METOPROLOL SUCCINATE 50 MG PO TB24
50 mg | ORAL_TABLET | Freq: Every day | ORAL | 3 refills | 90.00000 days | Status: AC
Start: 2018-02-19 — End: 2018-03-01

## 2018-02-19 MED ORDER — AMLODIPINE 10 MG PO TAB
10 mg | ORAL_TABLET | Freq: Every day | ORAL | 3 refills | Status: AC
Start: 2018-02-19 — End: 2018-02-19

## 2018-02-19 MED ORDER — METOPROLOL SUCCINATE 50 MG PO TB24
50 mg | ORAL_TABLET | Freq: Every day | ORAL | 3 refills | 90.00000 days | Status: AC
Start: 2018-02-19 — End: 2018-02-19

## 2018-02-21 ENCOUNTER — Encounter: Admit: 2018-02-21 | Discharge: 2018-02-21 | Payer: BC Managed Care – PPO

## 2018-02-26 ENCOUNTER — Encounter: Admit: 2018-02-26 | Discharge: 2018-02-26 | Payer: BC Managed Care – PPO

## 2018-03-01 ENCOUNTER — Encounter: Admit: 2018-03-01 | Discharge: 2018-03-01 | Payer: BC Managed Care – PPO

## 2018-03-01 MED ORDER — METOPROLOL SUCCINATE 100 MG PO TB24
100 mg | ORAL_TABLET | Freq: Every day | ORAL | 3 refills | 90.00000 days | Status: AC
Start: 2018-03-01 — End: 2018-05-28

## 2018-03-01 MED ORDER — METOPROLOL SUCCINATE 100 MG PO TB24
100 mg | ORAL_TABLET | Freq: Every day | ORAL | 3 refills | 90.00000 days | Status: AC
Start: 2018-03-01 — End: 2018-03-01

## 2018-03-04 ENCOUNTER — Encounter: Admit: 2018-03-04 | Discharge: 2018-03-05 | Payer: BC Managed Care – PPO

## 2018-03-05 ENCOUNTER — Encounter: Admit: 2018-03-05 | Discharge: 2018-03-05 | Payer: BC Managed Care – PPO

## 2018-03-05 DIAGNOSIS — S069X9D Unspecified intracranial injury with loss of consciousness of unspecified duration, subsequent encounter: Principal | ICD-10-CM

## 2018-03-05 DIAGNOSIS — R2689 Other abnormalities of gait and mobility: ICD-10-CM

## 2018-03-07 ENCOUNTER — Ambulatory Visit: Admit: 2018-03-07 | Discharge: 2018-03-08 | Payer: BC Managed Care – PPO

## 2018-03-07 ENCOUNTER — Encounter: Admit: 2018-03-07 | Discharge: 2018-03-07 | Payer: BC Managed Care – PPO

## 2018-03-07 DIAGNOSIS — S0219XD Other fracture of base of skull, subsequent encounter for fracture with routine healing: Secondary | ICD-10-CM

## 2018-03-07 DIAGNOSIS — I10 Essential (primary) hypertension: Secondary | ICD-10-CM

## 2018-03-07 DIAGNOSIS — M1990 Unspecified osteoarthritis, unspecified site: Secondary | ICD-10-CM

## 2018-03-07 DIAGNOSIS — S069X1D Unspecified intracranial injury with loss of consciousness of 30 minutes or less, subsequent encounter: Secondary | ICD-10-CM

## 2018-03-08 ENCOUNTER — Encounter: Admit: 2018-03-08 | Discharge: 2018-03-08 | Payer: BC Managed Care – PPO

## 2018-03-08 DIAGNOSIS — I10 Essential (primary) hypertension: Secondary | ICD-10-CM

## 2018-03-08 DIAGNOSIS — M1990 Unspecified osteoarthritis, unspecified site: Secondary | ICD-10-CM

## 2018-03-19 ENCOUNTER — Encounter: Admit: 2018-03-19 | Discharge: 2018-03-19 | Payer: BC Managed Care – PPO

## 2018-04-05 ENCOUNTER — Encounter: Admit: 2018-03-07 | Discharge: 2018-04-05 | Payer: BC Managed Care – PPO

## 2018-04-05 DIAGNOSIS — R2689 Other abnormalities of gait and mobility: Secondary | ICD-10-CM

## 2018-04-05 DIAGNOSIS — S069X9D Unspecified intracranial injury with loss of consciousness of unspecified duration, subsequent encounter: Secondary | ICD-10-CM

## 2018-04-05 DIAGNOSIS — R489 Unspecified symbolic dysfunctions: Secondary | ICD-10-CM

## 2018-05-17 ENCOUNTER — Encounter: Admit: 2018-05-17 | Discharge: 2018-05-17 | Payer: BC Managed Care – PPO

## 2018-05-20 MED ORDER — DIAZEPAM 10 MG PO TAB
10 mg | ORAL_TABLET | ORAL | 0 refills | 7.00000 days | Status: AC | PRN
Start: 2018-05-20 — End: 2019-05-07

## 2018-05-20 MED ORDER — AZITHROMYCIN 250 MG PO TAB
ORAL_TABLET | Freq: Every day | 0 refills
Start: 2018-05-20 — End: ?

## 2018-05-20 MED ORDER — DIAZEPAM 10 MG PO TAB
ORAL_TABLET | 0 refills | PRN
Start: 2018-05-20 — End: ?

## 2018-05-20 MED ORDER — AZITHROMYCIN 250 MG PO TAB
ORAL_TABLET | 0 refills | Status: AC
Start: 2018-05-20 — End: 2019-05-07

## 2018-05-20 NOTE — Telephone Encounter
Telephone Encounter Reply Note  Received request for refill of azithromycin (4 day treatment) and diazepam (4 tablets). Unclear indication for this request. Last office visit in 2017 with Dr. Ardelle Park. Has several telephone encounters with Dr. Newt Lukes for various medications but unclear from records the rationale. One old note suggest this patient may possibly be under Dr. Dawna Part care in the community, but I am not certain of this.    Reviewed [facility] with [place] [facility]. Data indicate some Rx by Dr. Newt Lukes in 2018 (last written 01/23/2017) for alprazolam, diazepam, and ADHD med.     Most recent alprazolam has a long latency between written and filled: Filled 06/20/2017, but written 01/23/2017.    In the setting of this lack of clarity:  - Short-term azithromycin likely is not appropriate to be refilled. If having symptoms, will need to have an office visit either here or in the community.  - Diazepam might be appropriate for refill, but I lack sufficient information to safely prescribe this. 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 Patient likely requires office visit for this as well.    Refill requests currently refused.    RT Dr. Newt Lukes in case he has more context and would feel comfortable prescribing these medications, if clinically appropriate.    RT Zenon Mayo, MA (sender).    Fidel Levy, M.D. Ph.D.  Assistant Professor of Family Medicine  [employer] [facility]  Pager [phone]

## 2018-05-21 ENCOUNTER — Encounter: Admit: 2018-05-21 | Discharge: 2018-05-21 | Payer: BC Managed Care – PPO

## 2018-05-28 ENCOUNTER — Encounter: Admit: 2018-05-28 | Discharge: 2018-05-28 | Payer: BC Managed Care – PPO

## 2018-05-28 MED ORDER — AMLODIPINE 10 MG PO TAB
10 mg | ORAL_TABLET | Freq: Every day | ORAL | 3 refills | Status: AC
Start: 2018-05-28 — End: ?

## 2018-05-28 MED ORDER — ATORVASTATIN 20 MG PO TAB
20 mg | ORAL_TABLET | Freq: Every day | ORAL | 0 refills | Status: AC
Start: 2018-05-28 — End: 2018-10-21

## 2018-05-28 MED ORDER — METOPROLOL SUCCINATE 100 MG PO TB24
100 mg | ORAL_TABLET | Freq: Every day | ORAL | 3 refills | 90.00000 days | Status: AC
Start: 2018-05-28 — End: ?

## 2018-05-28 MED ORDER — LISINOPRIL-HYDROCHLOROTHIAZIDE 20-25 MG PO TAB
1 | ORAL_TABLET | Freq: Every morning | ORAL | 3 refills | 30.00000 days | Status: AC
Start: 2018-05-28 — End: ?

## 2018-05-28 MED ORDER — FAMOTIDINE 20 MG PO TAB
20 mg | ORAL_TABLET | Freq: Two times a day (BID) | ORAL | 3 refills | 90.00000 days | Status: AC
Start: 2018-05-28 — End: ?

## 2018-08-29 ENCOUNTER — Encounter: Admit: 2018-08-29 | Discharge: 2018-08-29

## 2018-10-17 ENCOUNTER — Encounter: Admit: 2018-10-17 | Discharge: 2018-10-17

## 2018-10-17 NOTE — Telephone Encounter
Pt last seen 08/03/2015. Received rx fax requesting Atorvastatin. Labs last drawn 08/02/2016. Pt has already received 90 day supply 05/28/18. Outside of protocol.     Routing to Dr. Threasa Heads for refusal.   Routing to scheduling to set up an appt. With new PCP.

## 2018-10-17 NOTE — Telephone Encounter
Received refill request via efax from [facility] for Atorvastatin 20mg . Pt overdue for labs and OV. Routing to last provider who ordered medication for review.

## 2018-10-17 NOTE — Telephone Encounter
LVM for pt to call clinic to schedule appointment.

## 2018-10-21 MED ORDER — ATORVASTATIN 20 MG PO TAB
20 mg | ORAL_TABLET | Freq: Every day | ORAL | 3 refills | Status: AC
Start: 2018-10-21 — End: ?

## 2018-10-21 MED ORDER — ATORVASTATIN 20 MG PO TAB
20 mg | ORAL_TABLET | Freq: Every day | ORAL | 0 refills | Status: DC
Start: 2018-10-21 — End: 2018-10-21

## 2018-10-21 NOTE — Telephone Encounter
LVM for pt to call clinic to schedule appointment, 2nd attempt.

## 2018-10-21 NOTE — Telephone Encounter
Will refill

## 2018-10-22 NOTE — Telephone Encounter
LVM for pt to call clinic to schedule appointment, 3rd attempt closing encounter.

## 2019-03-31 ENCOUNTER — Encounter: Admit: 2019-03-31 | Discharge: 2019-03-31 | Payer: BC Managed Care – PPO

## 2019-03-31 NOTE — Telephone Encounter
Patient called the nursing line requesting a appt with [facility]. Patient was hoping to have the appt around the 1st of February. Reviewed [facility] schedule, he is booked during this time. Patient stated he now resides in [place]. He is open to having a teleheath appt. IB sent to [facility] for appt request.

## 2019-04-02 ENCOUNTER — Encounter: Admit: 2019-04-02 | Discharge: 2019-04-02 | Payer: BC Managed Care – PPO

## 2019-05-01 ENCOUNTER — Encounter: Admit: 2019-05-01 | Discharge: 2019-05-01 | Payer: BC Managed Care – PPO

## 2019-05-01 NOTE — Telephone Encounter
The pt reported by mychart he is taking meloxicam 15 mg daily, med list updated.

## 2019-05-07 ENCOUNTER — Encounter: Admit: 2019-05-07 | Discharge: 2019-05-07 | Payer: BC Managed Care – PPO

## 2019-05-07 ENCOUNTER — Ambulatory Visit: Admit: 2019-05-07 | Discharge: 2019-05-08 | Payer: BC Managed Care – PPO

## 2019-05-07 DIAGNOSIS — I10 Essential (primary) hypertension: Secondary | ICD-10-CM

## 2019-05-07 DIAGNOSIS — Z8249 Family history of ischemic heart disease and other diseases of the circulatory system: Secondary | ICD-10-CM

## 2019-05-07 DIAGNOSIS — I517 Cardiomegaly: Secondary | ICD-10-CM

## 2019-05-07 DIAGNOSIS — E782 Mixed hyperlipidemia: Secondary | ICD-10-CM

## 2019-05-07 DIAGNOSIS — M1990 Unspecified osteoarthritis, unspecified site: Secondary | ICD-10-CM

## 2019-05-07 NOTE — Patient Instructions
[name],  It was good to see you and talk with you today and I am glad that you are doing well in general.  We are not seeing any evidence of impairment in kidney function and the lipid panel is clearly improved compared to previous measurements.    Since the blood pressure in general has been running around 140/80-85 mmHg, and we want to shoot for a goal blood pressure of <or =130/80 mmHg, I would recommend increasing lisinopril/hydrochlorothiazide to a total dose of 40/25 mg daily.  That would most likely require taking 2 of the 20/12.5 mg tablets once daily in the morning.    Otherwise, I would recommend staying the course with the medications.    I am very glad to see that the triglycerides are well within normal limits and even though I would like to see the LDL cholesterol come down even further (closer to 85 mg/dL given that the "baseline" LDL cholesterol off medication was around 170 mg/dL several years ago), it is still not bad at all.  I am optimistic that with further efforts at therapeutic lifestyle changes, and particularly with efforts at a little more aerobic exercise and continued efforts at healthy diet, the LDL can come down further.    It is great to see that the weight has come down more than 10 pounds since our previous visit in 2018, and nearly 25 pounds in comparison to the weight that was recorded at our office visit in 2017.    Best wishes and stay safe,    Mal Misty

## 2020-04-15 IMAGING — MR MRI BRAIN WO CONTRAST
10 series · 48 of 48 positions shown · non-contrast
Comparison: None

INDICATION: History of trauma to the head with ringing of the ears for more than 2 years.
TECHNIQUE: MRI of the brain without intravenous contrast.  Multiplanar multisequence MRI of the brain was performed without intravenous contrast following internal auditory canal protocol.

[Series 5: t1_mpr_axial_pre · axial · 1.0mm · 0.98mm/px · z∈[-77,+92]mm · 10 of 173 slices shown]
[im 1/173]
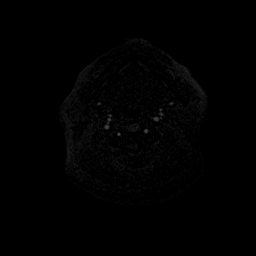
[im 20/173]
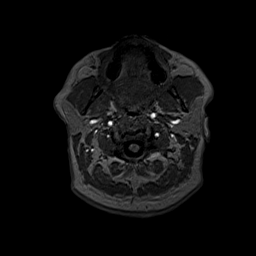
[im 39/173]
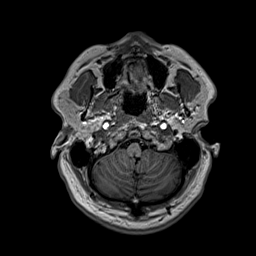
[im 58/173]
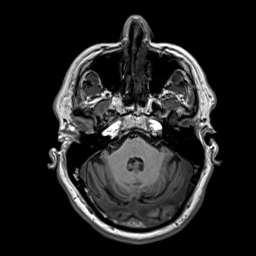
[im 77/173]
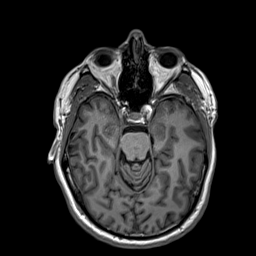
[im 96/173]
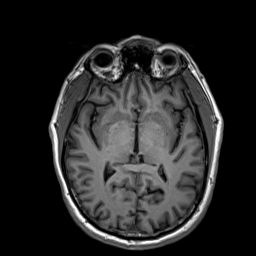
[im 115/173]
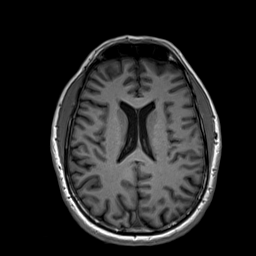
[im 134/173]
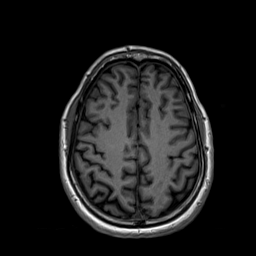
[im 153/173]
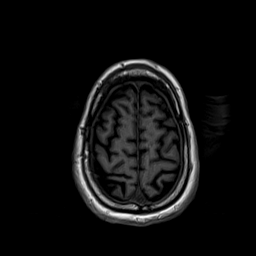
[im 173/173]
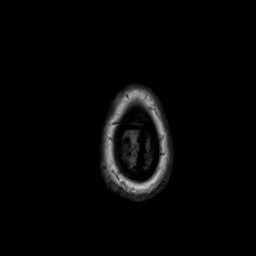

[Series 6: t1_mpr_axial_pre_mpr_sag · sagittal · 1.0mm · 0.98mm/px · 10 of 155 slices shown]
[im 1/155]
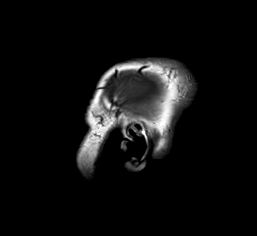
[im 18/155]
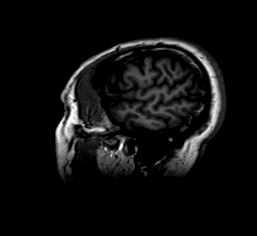
[im 35/155]
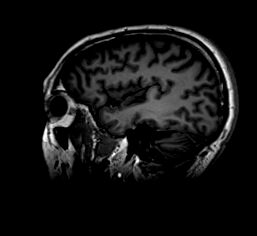
[im 52/155]
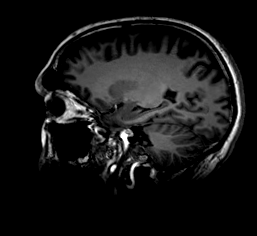
[im 69/155]
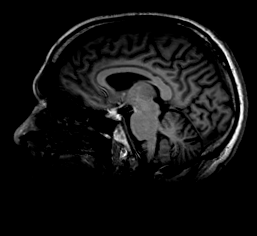
[im 86/155]
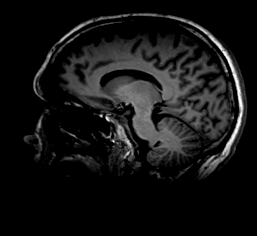
[im 103/155]
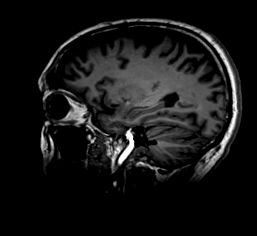
[im 120/155]
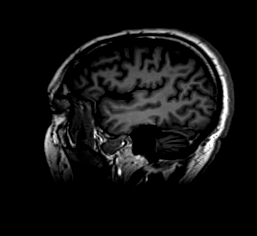
[im 137/155]
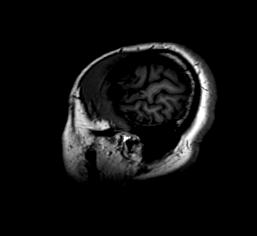
[im 155/155]
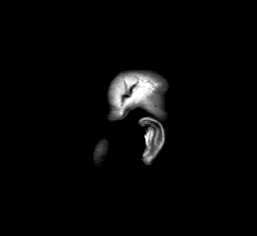

[Series 7: t1_mpr_axial_pre_mpr_cor · coronal · 1.0mm · 0.98mm/px · 13 of 200 slices shown]
[im 1/200]
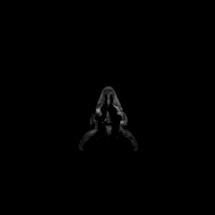
[im 17/200]
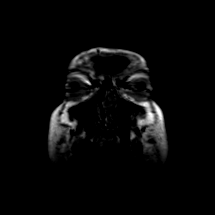
[im 34/200]
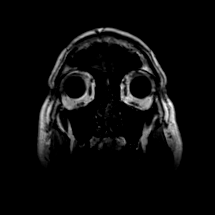
[im 50/200]
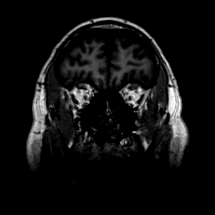
[im 67/200]
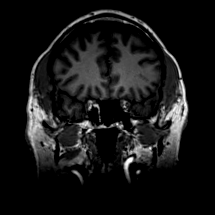
[im 83/200]
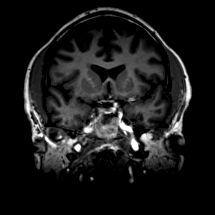
[im 100/200]
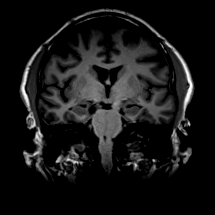
[im 117/200]
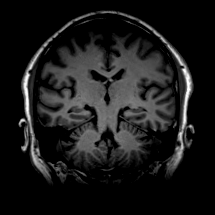
[im 133/200]
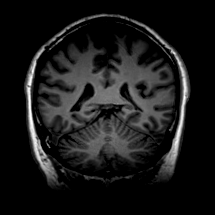
[im 150/200]
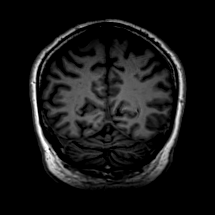
[im 166/200]
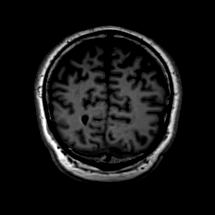
[im 183/200]
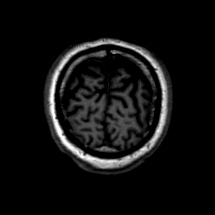
[im 200/200]
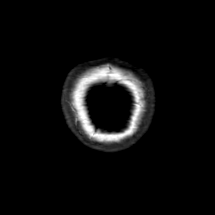

[Series 8: flair_axial_fs · axial · 4.0mm · 0.78mm/px · z∈[-62,+92]mm · 2 of 31 slices shown]
[im 1/31]
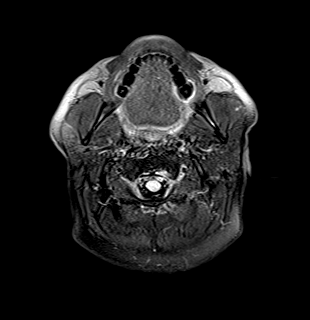
[im 31/31]
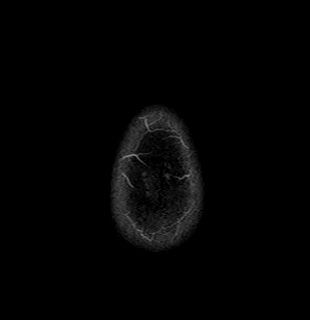

[Series 9: t2_axial · axial · 4.0mm · 0.39mm/px · z∈[-62,+92]mm · 2 of 31 slices shown]
[im 1/31]
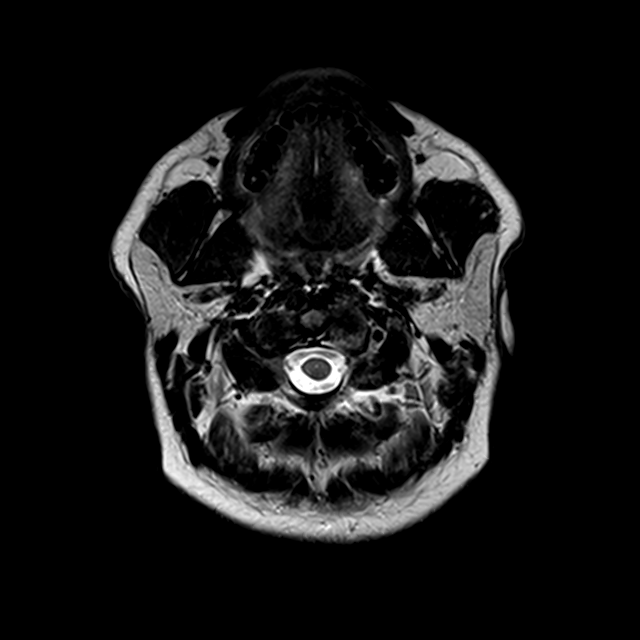
[im 31/31]
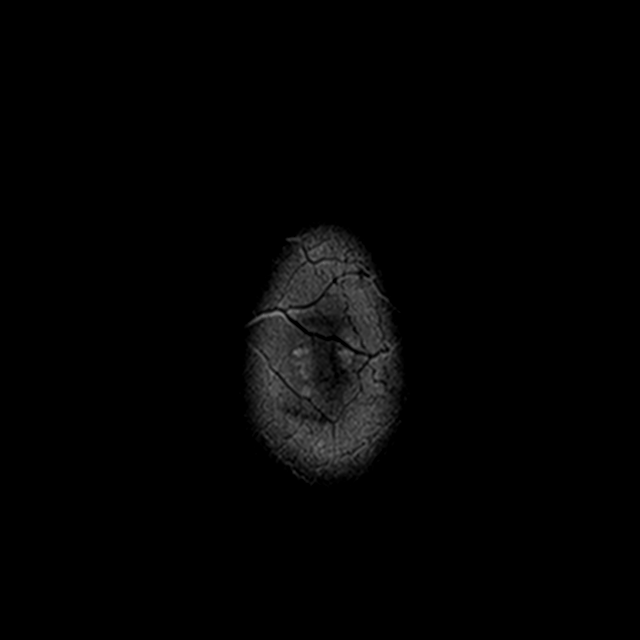

[Series 10: DWI · axial · 4.0mm · 0.98mm/px · z∈[-62,+92]mm · 2 of 31 slices shown (1 of 2)]
[im 1/31]
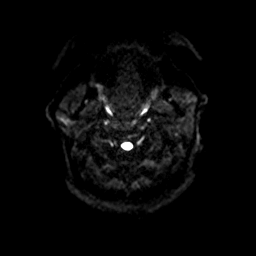
[im 31/31]
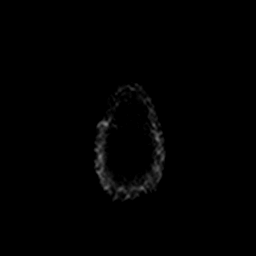

[Series 11: DWI · axial · 4.0mm · 0.98mm/px · z∈[-62,+92]mm · 2 of 31 slices shown (2 of 2)]
[im 1/31]
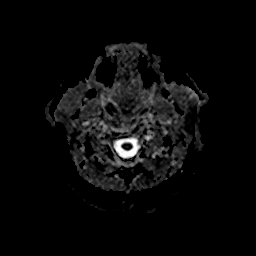
[im 31/31]
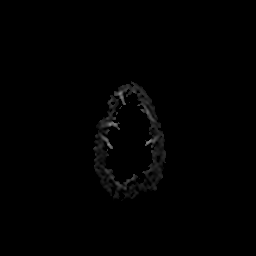

[Series 12: flash_axial · axial · 4.0mm · 0.78mm/px · z∈[-62,+92]mm · 2 of 31 slices shown]
[im 1/31]
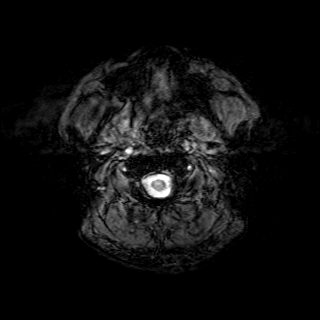
[im 31/31]
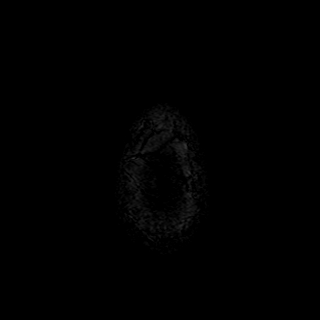

[Series 13: flair_sag_fs · sagittal · 4.0mm · 0.75mm/px · 2 of 25 slices shown]
[im 1/25]
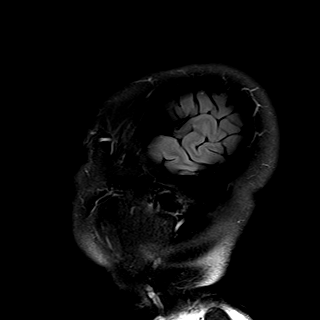
[im 25/25]
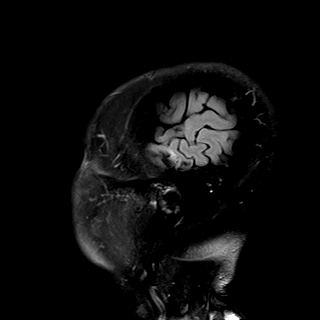

[Series 14: t2_ci3d_axial · axial · 0.9mm · 0.59mm/px · z∈[-30,+15]mm · 3 of 52 slices shown]
[im 1/52]
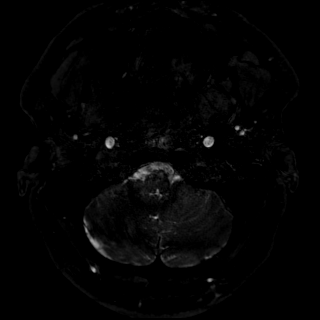
[im 26/52]
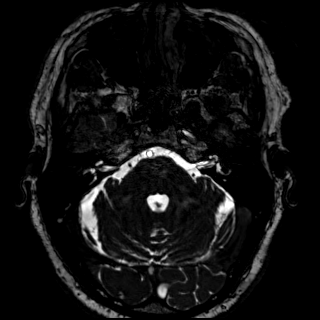
[im 52/52]
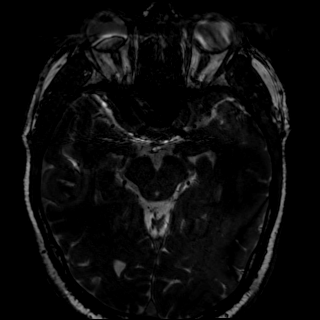

[48 of 48 positions shown; findings below may reference images not displayed]

FINDINGS: There are a few scattered focal areas of subcortical/deep periventricular focal areas of increase in signal intensity in the bilateral subcortical white matter seen on FLAIR and T2-weighted sequences. These show no evidence of abnormal signal intensity in the diffusion or gradient sequences to suggest acute demyelination or previous intracranial hemorrhage. These findings are in keeping with demyelination most likely due to chronic ischemia.  There is no mass, shift, ventricular dilatation or abnormal extra-axial fluid collection. 

The pituitary gland is of normal size. No suprasellar or parasellar mass lesion is seen. Optic nerves, optic chiasm and optic tracts are normal. The intracranial arteries have normal signal flow voids. The signal intensity of the bone marrow is normal.

The paranasal sinuses and mastoid air cells are well-aerated and clear. The intraorbital structures are normal.
IMPRESSION: Few bilateral scattered subcortical areas of nonspecific demyelination which may be due to chronic ischemia. There is no mass, shift, ventricular dilatation or abnormal extra-axial fluid collection.

## 2020-09-07 IMAGING — CR FOOT RT 2 VWS
1 series · 2 of 2 positions shown · non-contrast
Comparison: None

Right foot 2 views

INDICATIONS:  Pain

[Series 1: lat · 0.17mm/px · 2 of 2 slices shown]
[im 1/2]
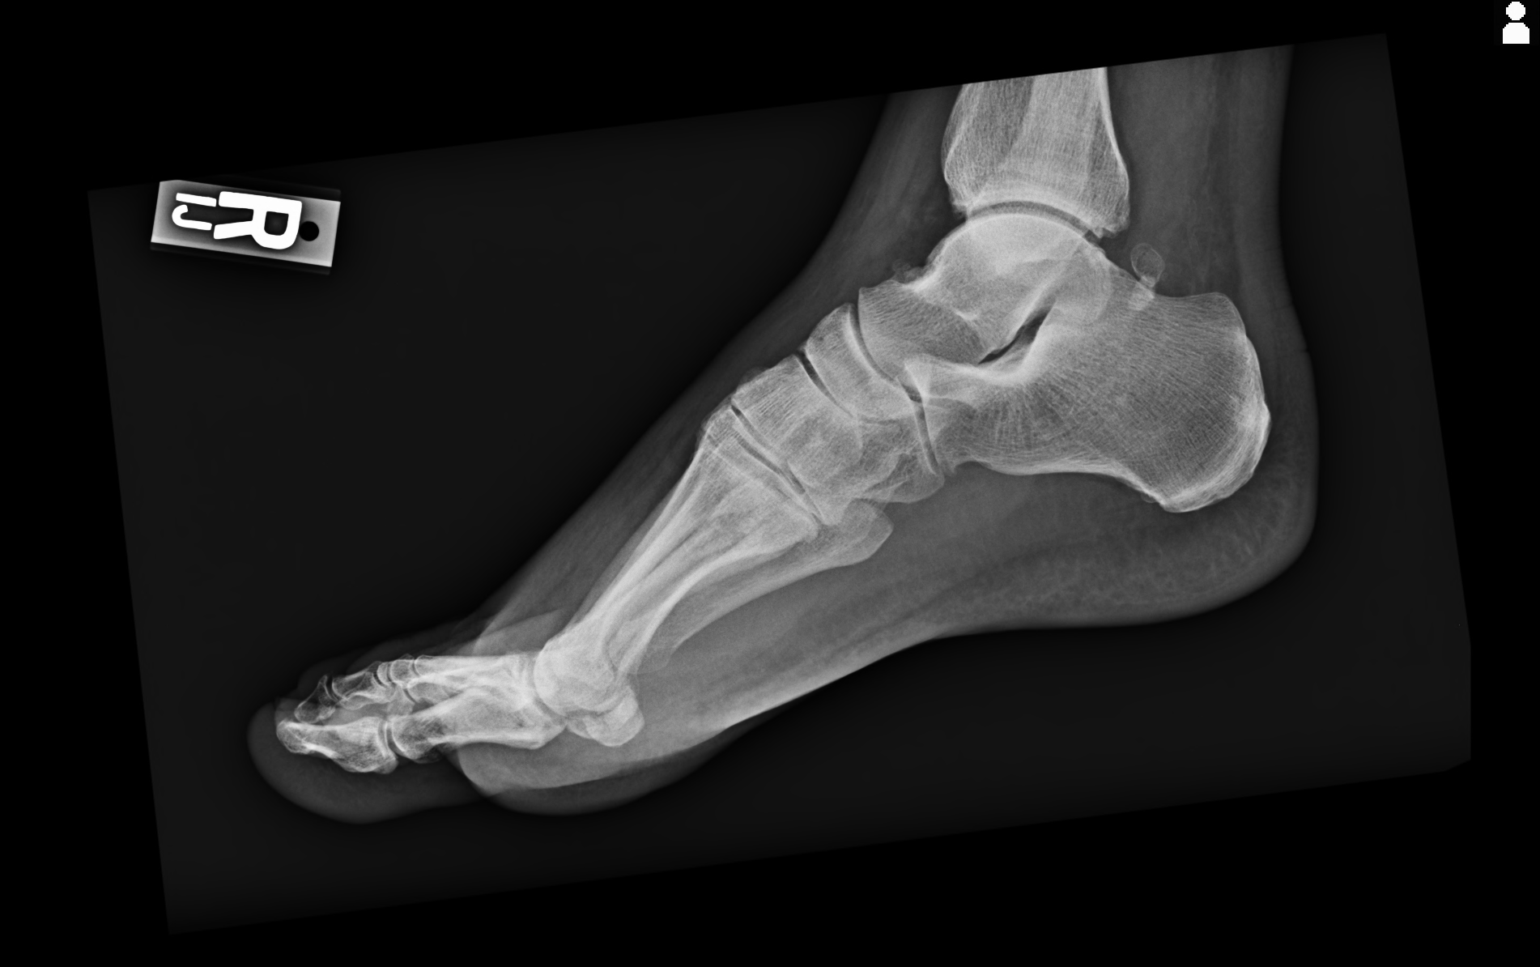
[im 2/2]
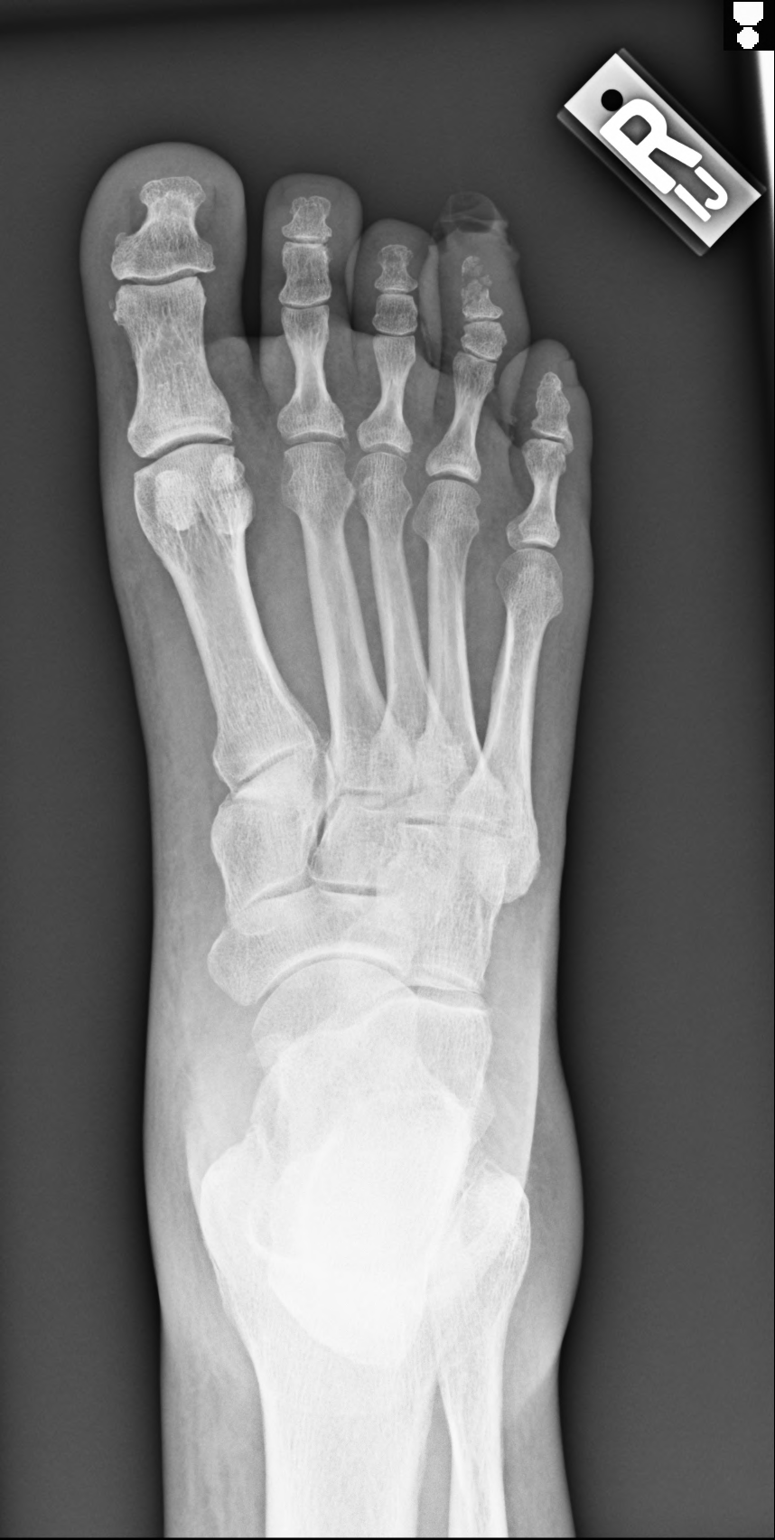

[2 of 2 positions shown; findings below may reference images not displayed]

FINDINGS: There is an acute crush-like fracture involving right fourth toe distal phalanx without intra-articular involvement. Overlying soft tissue swelling. No sign of opaque foreign body.

Remaining foot looks unremarkable.
IMPRESSION: Fourth toe acute fracture per above.

Stat fax

## 2020-09-07 IMAGING — CR TOES RT 2 VWS MIN
1 series · 2 of 2 positions shown · non-contrast
Comparison: None.

Right fourth toe 2 views

INDICATIONS:  Right fourth toe injury.

[Series 1: ap · 0.17mm/px · 2 of 2 slices shown]
[im 1/2]
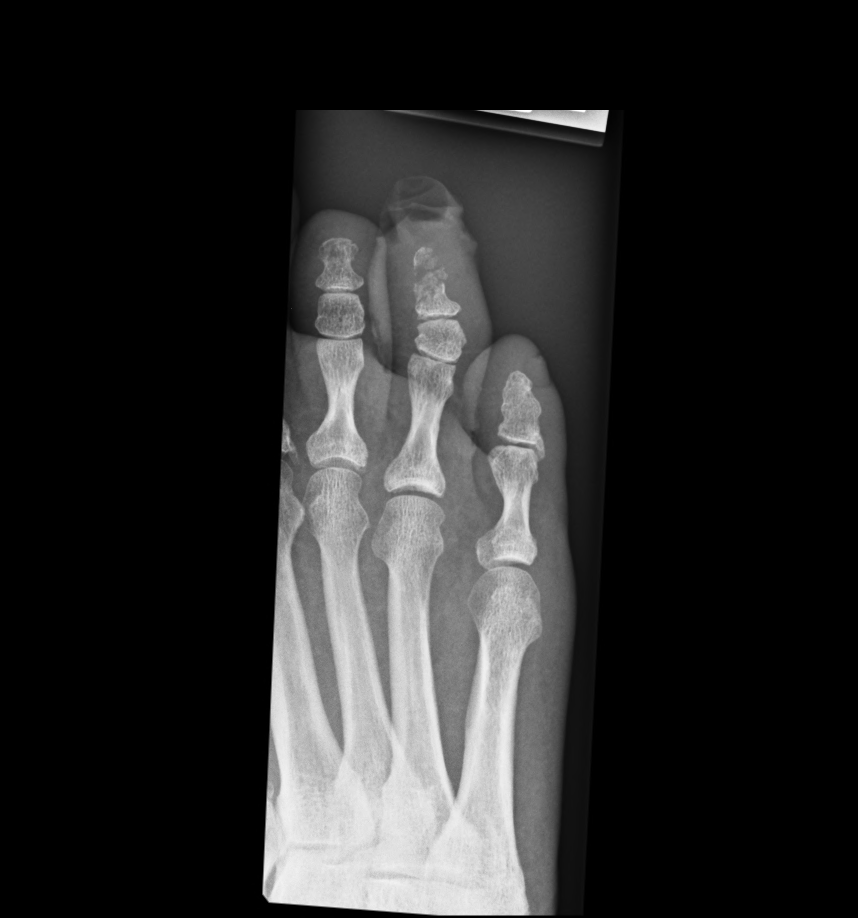
[im 2/2]
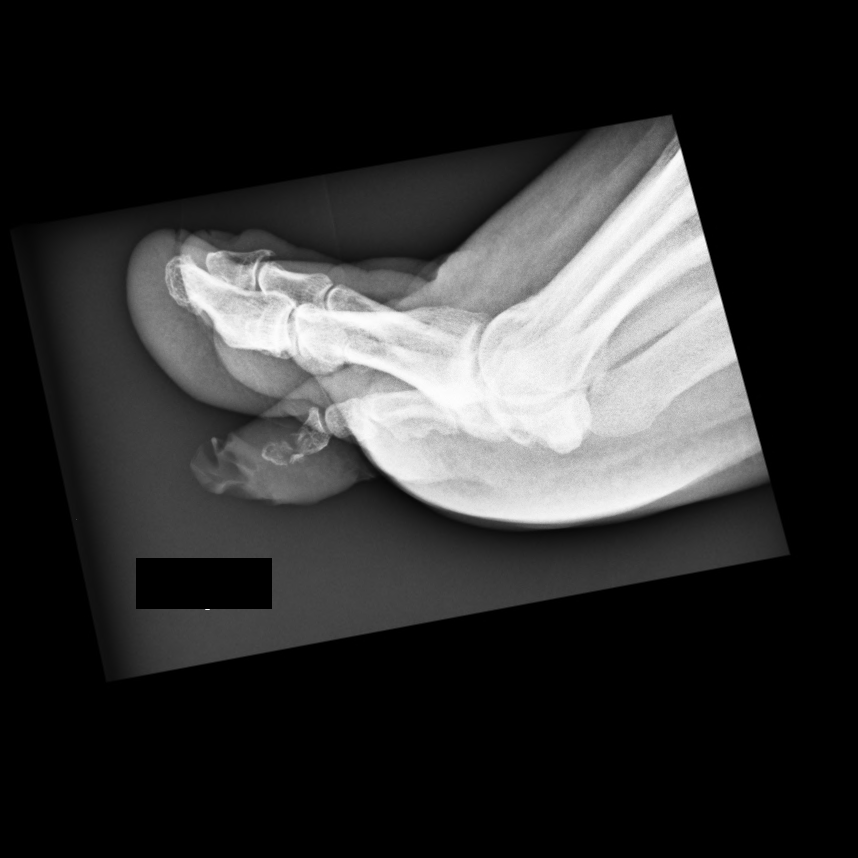

[2 of 2 positions shown; findings below may reference images not displayed]

FINDINGS/IMPRESSION: 

Again noted is the acute comminuted crush-like fracture involving fourth toe distal phalanx without intra-articular involvement nor opaque foreign body.

STAT fax

## 2020-11-23 IMAGING — US DOPPLER RENAL ARTERY
1 series · 13 of 25 positions shown · non-contrast
Comparison: none

HISTORY: Uncontrolled hypertension.
TECHNIQUE: Using real-time and Doppler ultrasound, both kidneys were evaluated.

[Series 1: doppler renal artery · 13 of 43 slices shown]
[im 1/43]
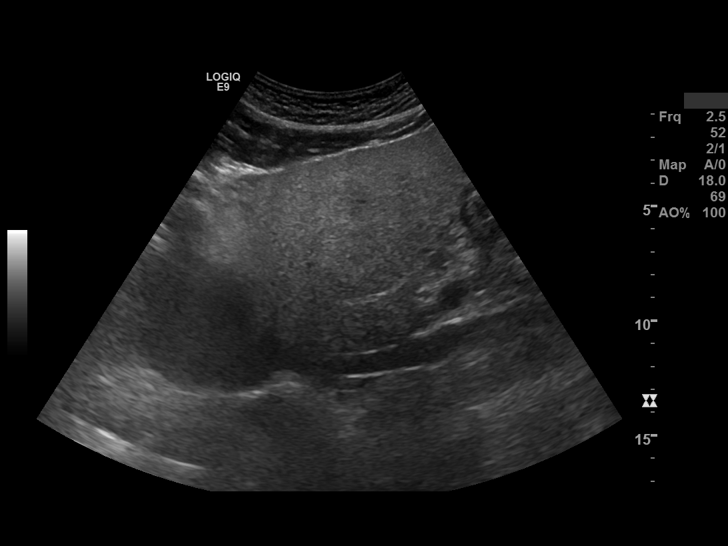
[im 4/43]
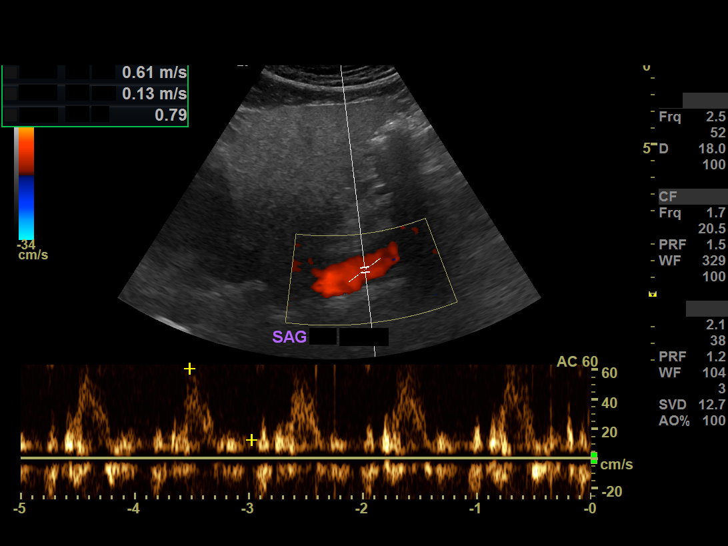
[im 8/43]
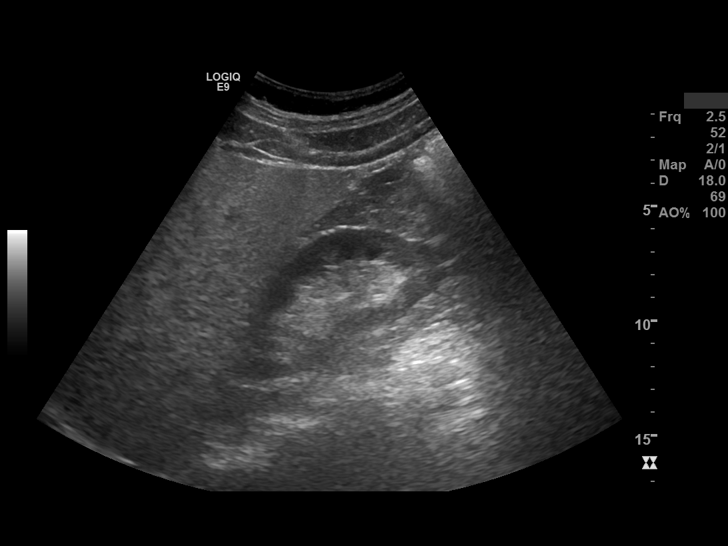
[im 11/43]
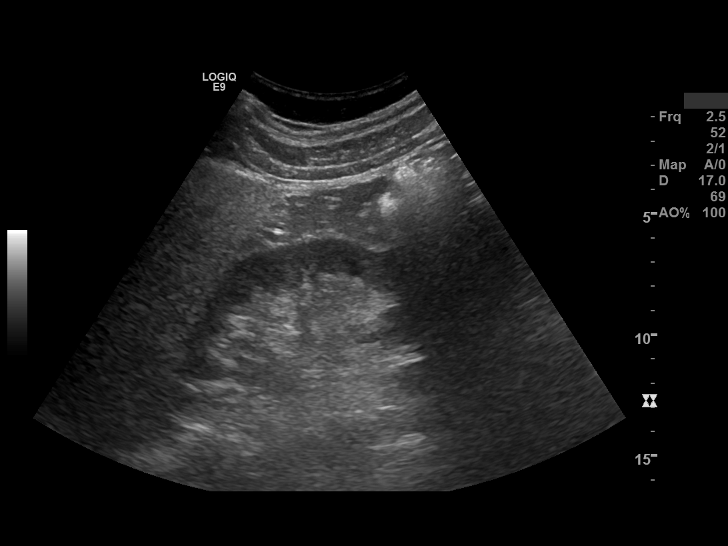
[im 15/43]
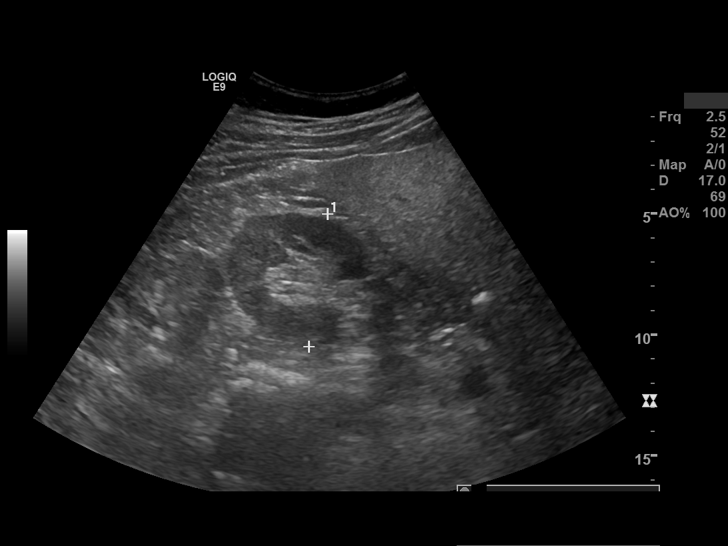
[im 18/43]
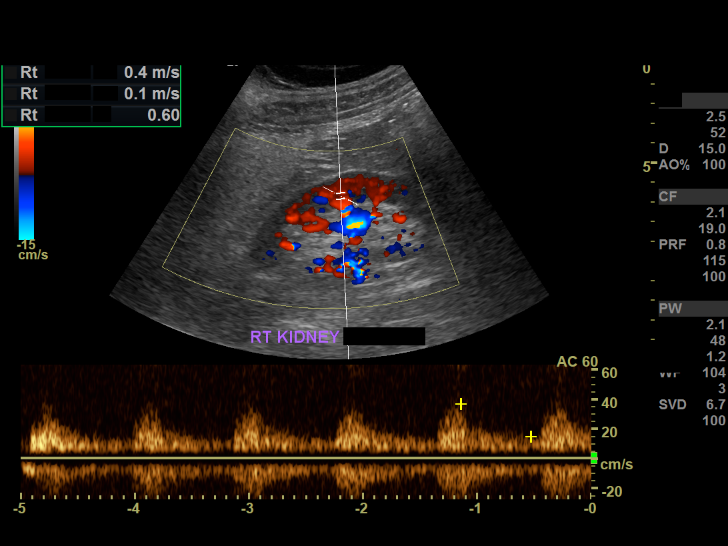
[im 22/43]
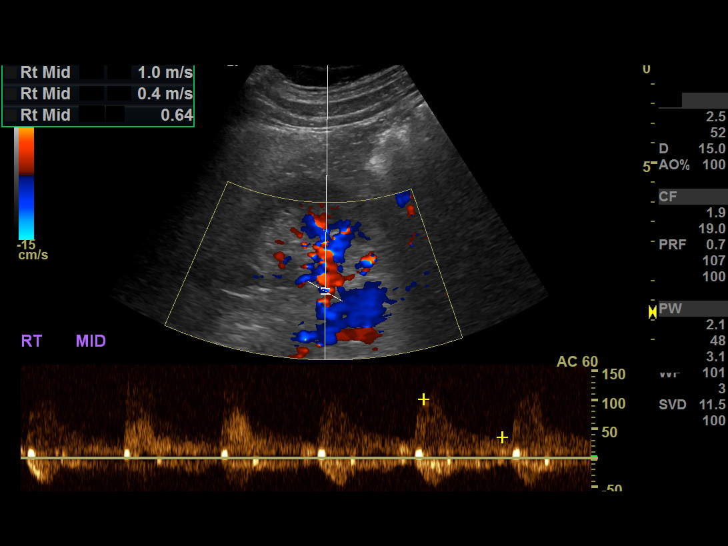
[im 25/43]
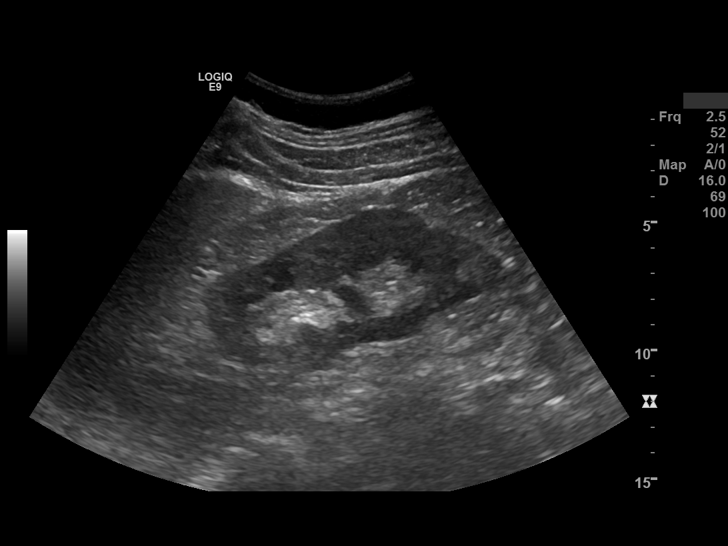
[im 29/43]
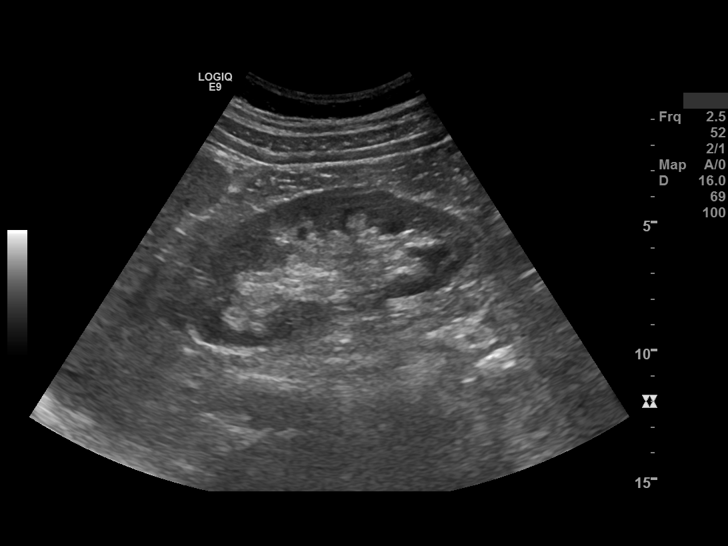
[im 32/43]
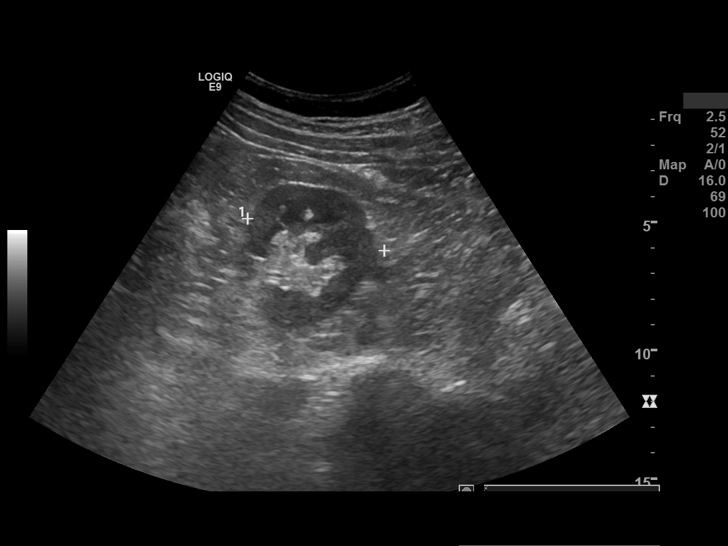
[im 36/43]
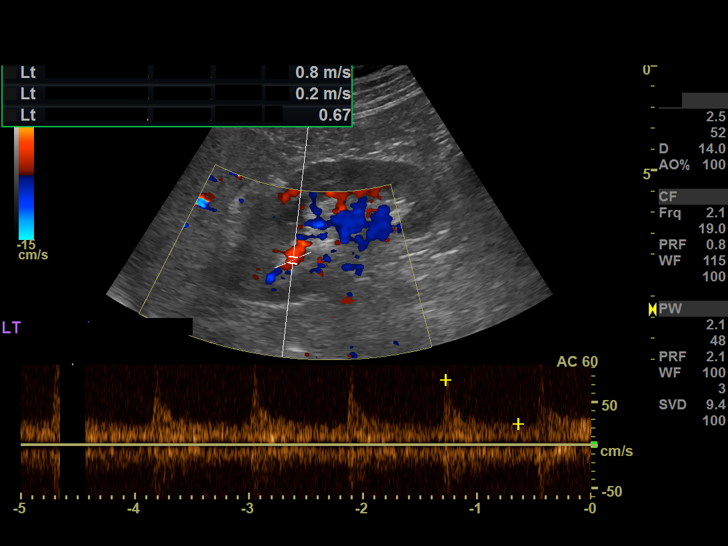
[im 39/43]
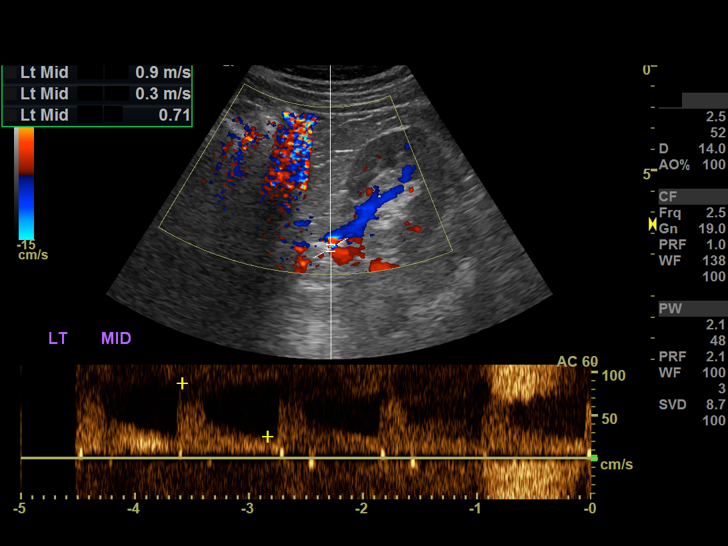
[im 43/43]
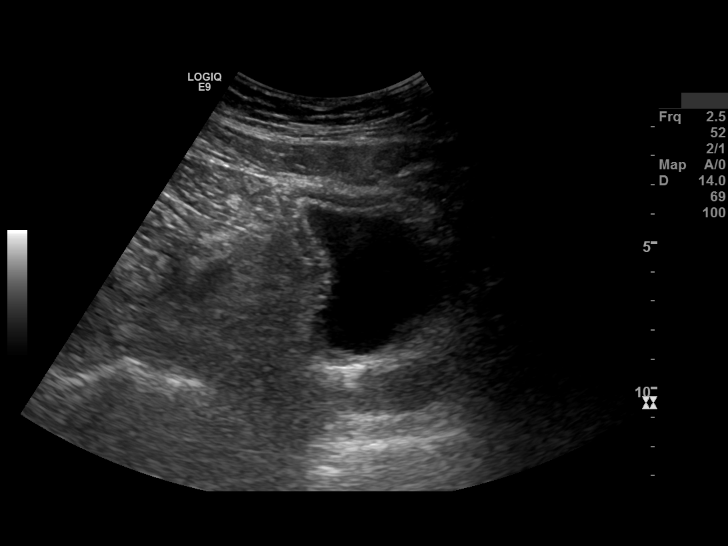

[13 of 25 positions shown; findings below may reference images not displayed]

FINDINGS: Cardiac Rhythm:  Regular

Right kidney measures 11.2 x 5.1 x 5.5 cm and has normal echotexture without hydronephrosis. Cortical thickness measures 22 mm.

Left kidney measures 12.1 x 5.0 x 5.5 cm and has a normal echotexture without hydronephrosis. Cortical thickness measures 23 mm. 

Bladder is within normal limits.

Abdominal aorta: The visualized portion of the abdominal aorta is unremarkable.

Inferior vena cava: The visualized portion of the inferior vena cava is unremarkable.

Common iliac arteries/Bifurcation: The visualized portion is unremarkable.

RESISTIVE INDEX 

RIGHT RENAL ARTERY

Prox (Origin)

Mid

Distal ([place])

Segmental upper pole

Segmental lower pole

Arcuate

Renal Venous Flow At [place]:   Present

LEFT RENAL ARTERY

Prox (Origin)

Mid

Distal ([place])

Segmental upper pole

Segmental lower pole

Arcuate

Renal Venous Flow At [place]: Present

RENAL ARTERY ORIGIN

Suprarenal AO-PSV       0.61 m/s

Rt Renal Art  PSV         1.1 m/s

Rt Renal to Aorta Ratio

Lt Renal Art  -  PSV        1.1 m/s

Lt Renal to Aorta Ratio

A ratio of 3.5 or greater indicates a greater than 60% stenosis

DISTAL RENAL ORIGIN

Suprarenal AO-PSV       0.61 m/s

Rt  Renal Art  PSV         0.84 m/s

Rt Renal to Aorta Ratio

Lt Renal Art  -  PSV        0.52 m/s

Lt Renal to Aorta Ratio

A ratio of 3.5 or greater indicates a greater than 60% stenosis
IMPRESSION: 1.
Negative exam.

2.
No evidence of hemodynamically significant stenosis.

3.
Normal resistive indices

## 2022-07-05 IMAGING — MR MRI HAND RT WO CONTRAST
6 series · 40 of 40 positions shown · non-contrast
Comparison: None.

Images Obtained from [facility]
INDICATION: Unspecified injury of other specified muscles, fascia and tendons at wrist and hand level, right hand, subsequent encounter.
TECHNIQUE: Multiplanar, multiecho imaging of the right hand was performed, including T1-weighted and fluid sensitive sequences without intravenous contrast administration.

[Series 3: t1_axial · axial · 4.0mm · 0.47mm/px · z∈[-73,+90]mm · 10 of 36 slices shown]
[im 1/36]
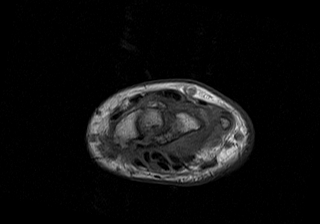
[im 4/36]
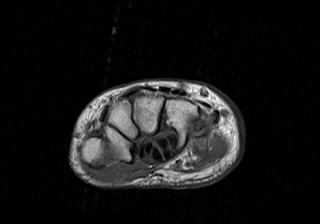
[im 8/36]
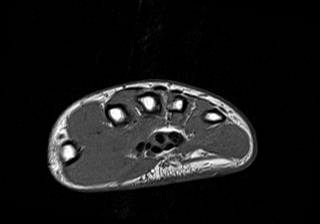
[im 12/36]
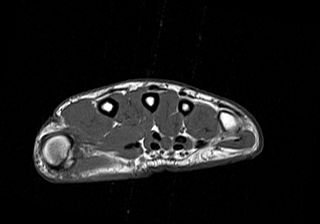
[im 16/36]
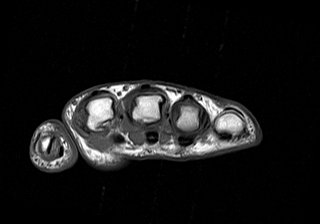
[im 20/36]
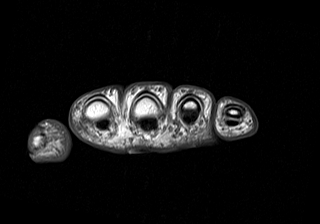
[im 24/36]
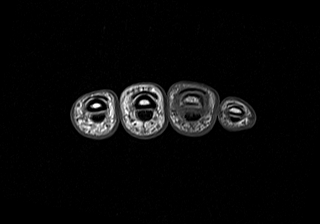
[im 28/36]
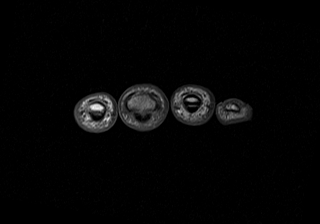
[im 32/36]
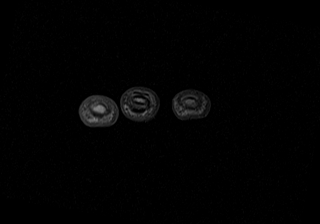
[im 36/36]
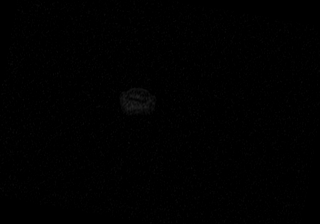

[Series 4: t2_axial_fs · axial · 4.0mm · 0.47mm/px · z∈[-73,+90]mm · 10 of 36 slices shown]
[im 1/36]
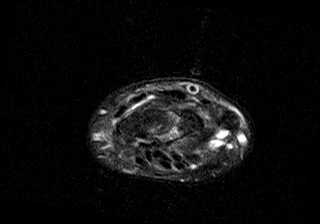
[im 4/36]
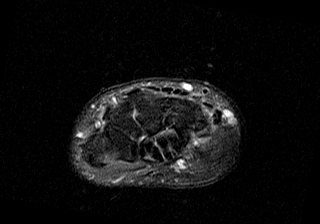
[im 8/36]
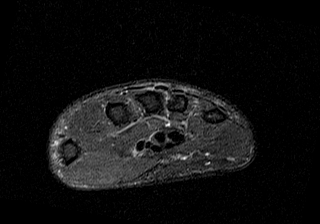
[im 12/36]
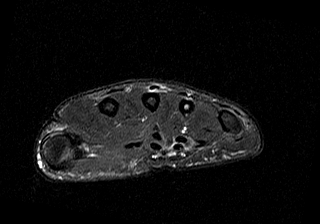
[im 16/36]
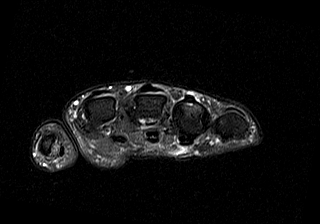
[im 20/36]
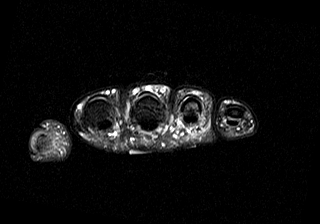
[im 24/36]
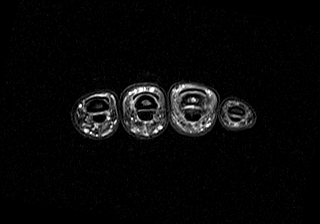
[im 28/36]
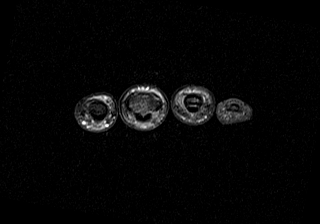
[im 32/36]
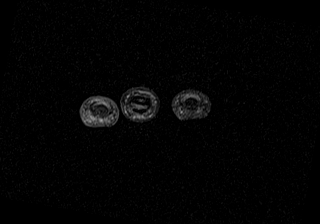
[im 36/36]
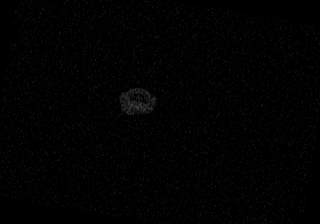

[Series 5: t1_sag · sagittal · 4.0mm · 0.58mm/px · 6 of 26 slices shown]
[im 1/26]
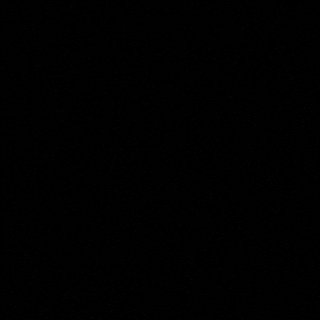
[im 6/26]
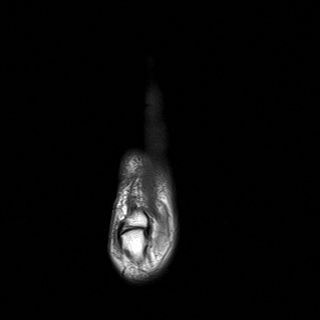
[im 11/26]
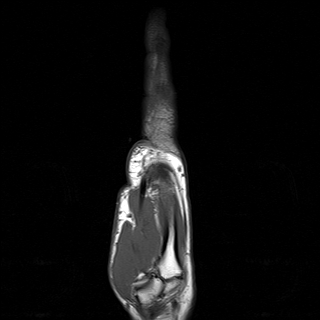
[im 16/26]
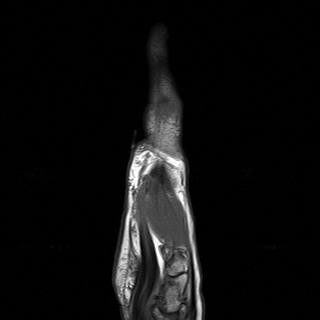
[im 21/26]
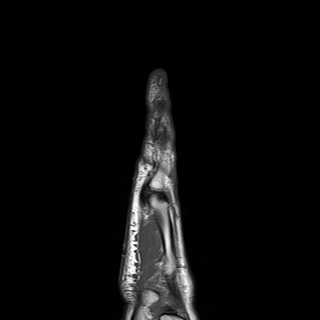
[im 26/26]
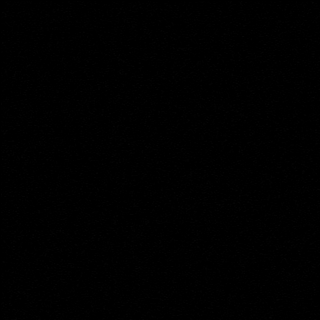

[Series 6: t2_sag_fs · sagittal · 4.0mm · 0.58mm/px · 6 of 26 slices shown]
[im 1/26]
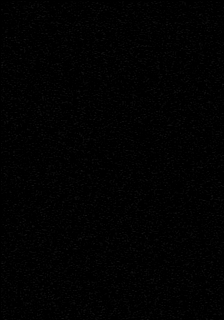
[im 6/26]
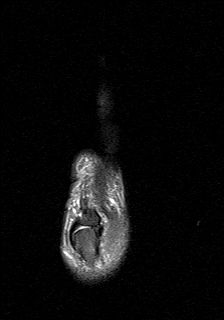
[im 11/26]
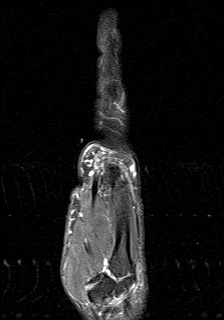
[im 16/26]
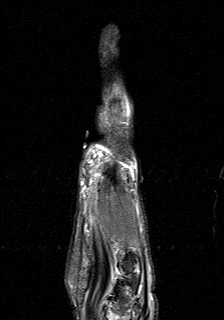
[im 21/26]
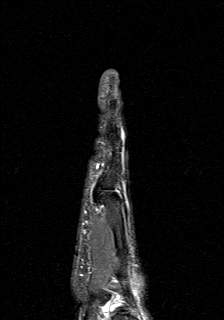
[im 26/26]
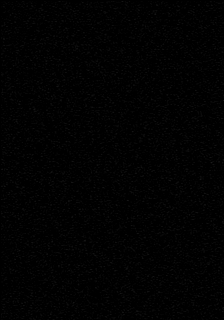

[Series 7: t1_cor · coronal · 3.0mm · 0.58mm/px · 4 of 18 slices shown]
[im 1/18]
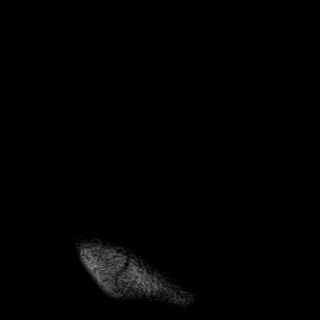
[im 6/18]
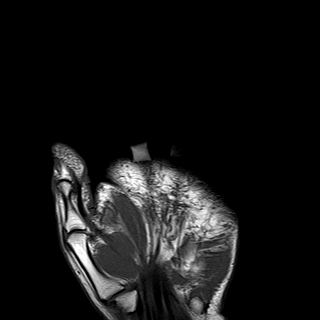
[im 12/18]
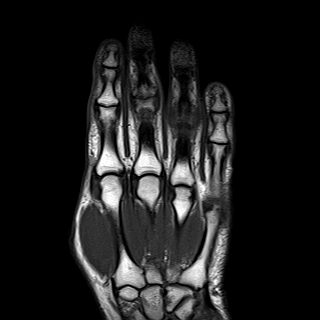
[im 18/18]
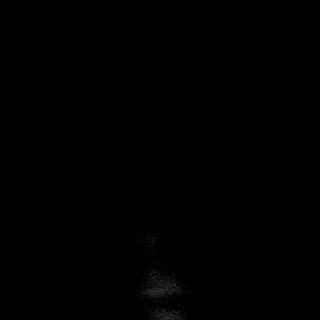

[Series 8: t2_cor_fs · coronal · 3.0mm · 0.56mm/px · 4 of 18 slices shown]
[im 1/18]
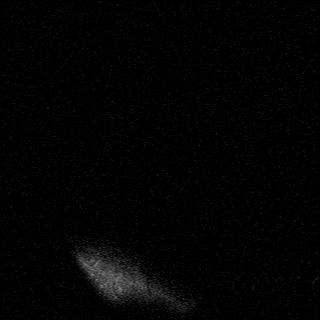
[im 6/18]
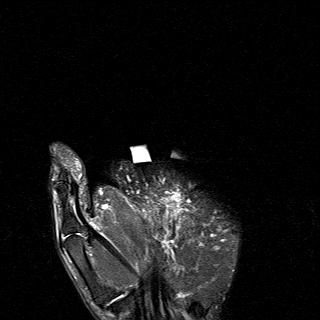
[im 12/18]
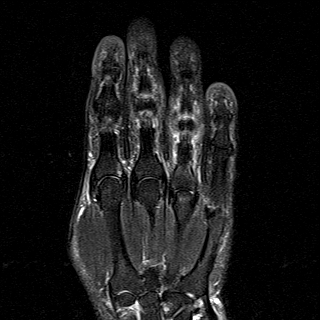
[im 18/18]
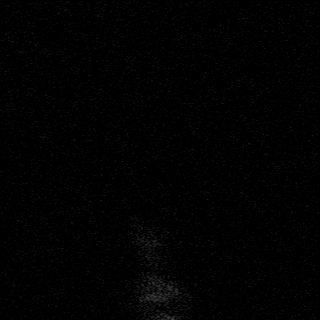

[40 of 40 positions shown; findings below may reference images not displayed]

FINDINGS: There is mild interphalangeal and first CMC DJD.
No joint effusions. No visible periarticular erosions.
Mild osseous edema about the PIP joints of the third and fourth digits. No visible fracture lines. Findings likely relating to osseous contusions.
Additionally, there is mild edema present surrounding both the radial and ulnar collateral ligaments of the PIP joints third and fourth digits suggesting low-grade sprains. No visible collateral
ligament tears.
Flexor and extensor tendons appear intact. No tenosynovitis.
Visualized intrinsic hand musculature demonstrates no atrophy, strain or denervation edema.
No cystic or solid soft tissue masses. No focal fluid collections.
IMPRESSION: 1.  Mild interphalangeal and first CMC DJD.
2.  Mild osseous edema about the PIP joints of the third and fourth digits likely relating to osseous contusions. No visible fracture lines. Correlate with radiographs.
3.  Mild edema present surrounding both the radial and ulnar collateral ligaments of the PIP joints of the third and fourth digits suggesting low-grade sprains.

## 2023-06-22 ENCOUNTER — Encounter: Admit: 2023-06-22 | Discharge: 2023-06-22 | Payer: BLUE CROSS/BLUE SHIELD
